# Patient Record
Sex: Female | Born: 1995 | Race: Black or African American | Hispanic: No | Marital: Single | Smoking: Never smoker
Health system: Southern US, Community
[De-identification: ages and names within clinical notes are randomized; demographics above are authoritative.]

## PROBLEM LIST (undated history)

## (undated) HISTORY — PX: TONSILLECTOMY: SUR1361

---

## 2015-07-15 ENCOUNTER — Ambulatory Visit
Admission: EM | Admit: 2015-07-15 | Discharge: 2015-07-15 | Disposition: A | Discharge status: Home / Self Care | Payer: PRIVATE HEALTH INSURANCE | Attending: Family Medicine | Admitting: Family Medicine

## 2015-07-15 ENCOUNTER — Encounter: Payer: Self-pay | Admitting: Emergency Medicine

## 2015-07-15 DIAGNOSIS — N760 Acute vaginitis: Secondary | ICD-10-CM

## 2015-07-15 DIAGNOSIS — R69 Illness, unspecified: Secondary | ICD-10-CM

## 2015-07-15 DIAGNOSIS — Z202 Contact with and (suspected) exposure to infections with a predominantly sexual mode of transmission: Secondary | ICD-10-CM

## 2015-07-15 LAB — WET PREP, GENITAL
SPERM: NONE SEEN
Trich, Wet Prep: NONE SEEN
YEAST WET PREP: NONE SEEN

## 2015-07-15 LAB — RAPID HIV SCREEN (HIV 1/2 AB+AG)
HIV 1/2 Antibodies: NONREACTIVE
HIV-1 P24 Antigen - HIV24: NONREACTIVE

## 2015-07-15 LAB — CHLAMYDIA/NGC RT PCR (ARMC ONLY)
CHLAMYDIA TR: NOT DETECTED
N gonorrhoeae: NOT DETECTED

## 2015-07-15 MED ORDER — METRONIDAZOLE 500 MG PO TABS: 500.0000 mg | ORAL_TABLET | Freq: Two times a day (BID) | ORAL | Status: DC

## 2015-07-15 NOTE — ED Provider Notes (Signed)
CSN: [id number]     Arrival date & time 07/15/15  1119 History   First MD Initiated Contact with Patient 07/15/15 1208    Nurses notes were reviewed.  Chief Complaint  Patient presents with  . Exposure to STD    As patient was texting and working on her phone she explains to me that she started having a vaginal smell on Wednesday after showering from having sexual relations with another female. She denies a discharge but states it had the smell of she's had before. It turned out that she did have sexual relations with a female partner about 2-3 weeks before that was unprotected. She denies any pain and denies having a discharge either. She has had chlamydia infection for the past one offered of testing for HIV and syphilis she's agreed to that testing as well. As she puts it she was be tested for all. She denies any lesions or any outbreaks in the vaginal area either.                 Consider location/radiation/quality/duration/timing/severity/associated sxs/prior Treatment) Patient is a 19 y.o. female presenting with STD exposure. The history is provided by the patient. No language interpreter was used.  Exposure to STD This is a new problem. The current episode started 2 days ago. The problem occurs constantly. The problem has been gradually worsening. Pertinent negatives include no chest pain, no abdominal pain and no headaches. Nothing aggravates the symptoms. Nothing relieves the symptoms. She has tried nothing for the symptoms.    History reviewed. No pertinent past medical history. Past Surgical History  Procedure Laterality Date  . Tonsillectomy     No family history on file. Social History  Substance Use Topics  . Smoking status: Never Smoker   . Smokeless tobacco: Never Used  . Alcohol Use: No   OB History    No data available     Review of Systems  Cardiovascular: Negative for chest pain.  Gastrointestinal: Negative for abdominal pain.  Genitourinary: Negative for  dysuria, urgency, frequency, vaginal bleeding, vaginal discharge, difficulty urinating, genital sores, vaginal pain, pelvic pain and dyspareunia.       Vaginal odor is present  Neurological: Negative for headaches.  All other systems reviewed and are negative.   Allergies  Review of patient's allergies indicates no known allergies.  Home Medications   Prior to Admission medications   Not on File   Meds Ordered and Administered this Visit  Medications - No data to display  BP 92/70 mmHg  Pulse 66  Temp(Src) 98.9 F (37.2 C) (Tympanic)  Resp 16  Ht 5' (1.524 m)  Wt 114 lb (51.71 kg)  BMI 22.26 kg/m2  SpO2 98% No data found.   Physical Exam  Constitutional: She is oriented to person, place, and time. She appears well-developed and well-nourished.  HENT:  Head: Normocephalic and atraumatic.  Eyes: Pupils are equal, round, and reactive to light.  Neck: Normal range of motion.  Abdominal: Soft. She exhibits no distension. There is no tenderness. Hernia confirmed negative in the right inguinal area and confirmed negative in the left inguinal area.  Genitourinary: Rectum normal and uterus normal. Rectal exam shows no external hemorrhoid, no internal hemorrhoid, no fissure, no tenderness and anal tone normal. There is no rash, tenderness or lesion on the right labia. There is no rash, tenderness or lesion on the left labia. Cervix exhibits discharge. Cervix exhibits no friability. No erythema, tenderness or bleeding in the vagina. No foreign body  around the vagina. Vaginal discharge found.  While patient denies having any type of discharge there is a heavy discharge in the posterior fornix. Discharged relation: Ashby Dawesature was be consistent with a trichomonas infection. [place] swab was used to clean the area out just to make sure there was no foreign objects in the posterior fornix and none was seen. No signs of uterine tenderness rectal examination was negative as well.  Musculoskeletal:  Normal range of motion. She exhibits no edema.  Neurological: She is alert and oriented to person, place, and time.  Skin: Skin is warm and dry.  Vitals reviewed.   ED Course  Procedures (including critical care time)  Labs Review Labs Reviewed  WET PREP, GENITAL  CHLAMYDIA/NGC RT PCR ([facility] ONLY)  RAPID HIV SCREEN (HIV 1/2 AB+AG)  RPR    Imaging Review No results found.   Visual Acuity Review  Right Eye Distance:   Left Eye Distance:   Bilateral Distance:    Right Eye Near:   Left Eye Near:    Bilateral Near:      Results for orders placed or performed during the hospital encounter of 07/15/15  Wet prep, genital  Result Value Ref Range   Yeast Wet Prep HPF POC NONE SEEN NONE SEEN   Trich, Wet Prep NONE SEEN NONE SEEN   Clue Cells Wet Prep HPF POC FEW (A) NONE SEEN   WBC, Wet Prep HPF POC MODERATE (A) NONE SEEN   Sperm PENDING   Rapid HIV screen (HIV 1/2 Ab+Ag) ([facility] Only)  Result Value Ref Range   HIV-1 P24 Antigen - HIV24 NON REACTIVE NON REACTIVE   HIV 1/2 Antibodies NON REACTIVE NON REACTIVE   Interpretation (HIV Ag Ab)      A non reactive test result means that HIV 1 or HIV 2 antibodies and HIV 1 p24 antigen were not detected in the specimen.    MDM  No diagnosis found.   Patient had heavy vaginal discharge was to correct trick but lab only shows clue cells. Will treat to cover both conditions with Flagyl 500 mg 1 tablet twice a day for 10 days. Chlamydia and GC RPR still pending HIV rapid test was negative.  Follow-up PCP of choice in about 2-3 weeks and will recommend pelvic rest also for 2 weeks as well.   Hassan RowanEugene Chaunice Obie, MD 07/15/15 [id number]

## 2015-07-15 NOTE — ED Notes (Signed)
Fishy odor, no discharge, pelvic pain, but feels swollen.

## 2015-07-15 NOTE — Discharge Instructions (Signed)
Bacterial Vaginosis °Bacterial vaginosis is an infection of the vagina. It happens when too many germs (bacteria) grow in the vagina. Having this infection puts you at risk for getting other infections from sex. Treating this infection can help lower your risk for other infections, such as:  °· Chlamydia. °· Gonorrhea. °· HIV. °· Herpes. °HOME CARE °· Take your medicine as told by your doctor. °· Finish your medicine even if you start to feel better. °· Tell your sex partner that you have an infection. They should see their doctor for treatment. °· During treatment: °· Avoid sex or use condoms correctly. °· Do not douche. °· Do not drink alcohol unless your doctor tells you it is ok. °· Do not breastfeed unless your doctor tells you it is ok. °GET HELP IF: °· You are not getting better after 3 days of treatment. °· You have more grey fluid (discharge) coming from your vagina than before. °· You have more pain than before. °· You have a fever. °MAKE SURE YOU:  °· Understand these instructions. °· Will watch your condition. °· Will get help right away if you are not doing well or get worse. °  °This information is not intended to replace advice given to you by your health care provider. Make sure you discuss any questions you have with your health care provider. °  °Document Released: 05/15/2008 Document Revised: 08/27/2014 Document Reviewed: 03/18/2013 °Elsevier Interactive Patient Education ©2016 [employer] ° °Vaginitis °Vaginitis is an inflammation of the vagina. It can happen when the normal bacteria and yeast in the vagina grow too much. There are different types. Treatment will depend on the type you have. °HOME CARE °· Take all medicines as told by your doctor. °· Keep your vagina area clean and dry. Avoid soap. Rinse the area with water. °· Avoid washing and cleaning out the vagina (douching). °· Do not use tampons or have sex (intercourse) until your treatment is done. °· Wipe from front to back after  going to the restroom. °· Wear cotton underwear. °· Avoid wearing underwear while you sleep until your vaginitis is gone. °· Avoid tight pants. Avoid underwear or nylons without a cotton panel. °· Take off wet clothing (such as a bathing suit) as soon as you can. °· Use mild, unscented products. Avoid fabric softeners and scented: °¨ Feminine sprays. °¨ Laundry detergents. °¨ Tampons. °¨ Soaps or bubble baths. °· Practice safe sex and use condoms. °GET HELP RIGHT AWAY IF:  °· You have belly (abdominal) pain. °· You have a fever or lasting symptoms for more than 2-3 days. °· You have a fever and your symptoms suddenly get worse. °MAKE SURE YOU:  °· Understand these instructions. °· Will watch this condition. °· Will get help right away if you are not doing well or get worse. °  °This information is not intended to replace advice given to you by your health care provider. Make sure you discuss any questions you have with your health care provider. °  °Document Released: 11/02/2008 Document Revised: 04/30/2012 Document Reviewed: 01/17/2012 °Elsevier Interactive Patient Education ©2016 [employer]vier Inc. ° °

## 2015-07-16 LAB — RPR: RPR Ser Ql: NONREACTIVE

## 2016-02-07 ENCOUNTER — Inpatient Hospital Stay (HOSPITAL_COMMUNITY)
Admission: AD | Admit: 2016-02-07 | Discharge: 2016-02-07 | Disposition: A | Discharge status: Home / Self Care | Payer: Self-pay | Source: Ambulatory Visit | Attending: Family Medicine | Admitting: Family Medicine

## 2016-02-07 DIAGNOSIS — L0889 Other specified local infections of the skin and subcutaneous tissue: Secondary | ICD-10-CM

## 2016-02-07 DIAGNOSIS — L089 Local infection of the skin and subcutaneous tissue, unspecified: Secondary | ICD-10-CM

## 2016-02-07 DIAGNOSIS — N6452 Nipple discharge: Secondary | ICD-10-CM | POA: Insufficient documentation

## 2016-02-07 DIAGNOSIS — N63 Unspecified lump in breast: Secondary | ICD-10-CM | POA: Insufficient documentation

## 2016-02-07 MED ORDER — SULFAMETHOXAZOLE-TRIMETHOPRIM 800-160 MG PO TABS: 1.0000 | ORAL_TABLET | Freq: Two times a day (BID) | ORAL | Status: DC

## 2016-02-07 NOTE — MAU Provider Note (Signed)
  History     CSN: [id number]  Arrival date and time: 02/07/16 2058   First Provider Initiated Contact with Patient 02/07/16 2129      Chief Complaint  Patient presents with  . Breast Mass   HPI Ms. Tracie Hogan is a 20 y.o. presents to MAU today with complaint of breast lump. The patient states that she first noted the area ~ 2 weeks ago. At the time she thought it was simply a skin issue. She states that today it became bigger and more painful. She denies itching, but has noted mild surrounding erythema. She has not taken anything for pain. She denies drainage from the area, nipple discharge, fever or other areas of concern.   OB History    No data available      No past medical history on file.  No past surgical history on file.  No family history on file.  Social History  Substance Use Topics  . Smoking status: Not on file  . Smokeless tobacco: Not on file  . Alcohol Use: Not on file    Allergies: No Known Allergies  No prescriptions prior to admission    Review of Systems  Constitutional: Negative for fever and malaise/fatigue.   Physical Exam   Blood pressure 114/80, pulse 89, temperature 98.4 F (36.9 C), temperature source Oral, resp. rate 18, height 5' (1.524 m), weight 102 lb 6.4 oz (46.448 kg), last menstrual period 01/11/2016, SpO2 100 %.  Physical Exam  Nursing note and vitals reviewed. Constitutional: She is oriented to person, place, and time. She appears well-developed and well-nourished. No distress.  HENT:  Head: Normocephalic and atraumatic.  Cardiovascular: Normal rate.   Respiratory: Effort normal. Right breast exhibits no inverted nipple, no mass, no nipple discharge, no skin change and no tenderness. Left breast exhibits skin change. Left breast exhibits no inverted nipple, no mass, no nipple discharge and no tenderness. Breasts are symmetrical.    GI: Soft. She exhibits no distension.  Neurological: She is alert and oriented to person,  place, and time.  Skin: Skin is warm and dry. No erythema.  Psychiatric: She has a normal mood and affect.    MAU Course  Procedures None  MDM Discussed with patient that this is most likely a skin issue and not related to the breast tissue. The patient desires further evaluation. Advised that we would refer to the breast center as [facility].   Assessment and Plan  A: Pusutle on breast Breast lump, 3:00   A: Discharge home Rx for Bactrim given to patient  Warning signs for worsening condition discussed Patient [facility] Patient may return to MAU as needed or if her condition were to change or worsen   Marny LowensteinJulie N Shannen [place]ans[place], PA-C  02/08/2016, 12:29 AM

## 2016-02-07 NOTE — Discharge Instructions (Signed)
Abscess °An abscess (boil or furuncle) is an infected area on or under the skin. This area is filled with yellowish-white fluid (pus) and other material (debris). °HOME CARE  °· Only take medicines as told by your doctor. °· If you were given antibiotic medicine, take it as directed. Finish the medicine even if you start to feel better. °· If gauze is used, follow your doctor's directions for changing the gauze. °· To avoid spreading the infection: °¨ Keep your abscess covered with a bandage. °¨ Wash your hands well. °¨ Do not share personal care items, towels, or whirlpools with others. °¨ Avoid skin contact with others. °· Keep your skin and clothes clean around the abscess. °· Keep all doctor visits as told. °GET HELP RIGHT AWAY IF:  °· You have more pain, puffiness (swelling), or redness in the wound site. °· You have more fluid or blood coming from the wound site. °· You have muscle aches, chills, or you feel sick. °· You have a fever. °MAKE SURE YOU:  °· Understand these instructions. °· Will watch your condition. °· Will get help right away if you are not doing well or get worse. °  °This information is not intended to replace advice given to you by your health care provider. Make sure you discuss any questions you have with your health care provider. °  °Document Released: 01/23/2008 Document Revised: 02/05/2012 Document Reviewed: 10/20/2011 °Elsevier Interactive Patient Education ©2016 Elsevier Inc. ° °

## 2016-02-07 NOTE — MAU Note (Signed)
Pt has a bump on left breast-states that it is painful and swollen. Has been there about 1-2 weeks now.

## 2016-02-08 ENCOUNTER — Other Ambulatory Visit: Payer: Self-pay | Admitting: Medical

## 2016-02-08 DIAGNOSIS — N63 Unspecified lump in unspecified breast: Secondary | ICD-10-CM

## 2016-02-14 ENCOUNTER — Ambulatory Visit
Admission: RE | Admit: 2016-02-14 | Discharge: 2016-02-14 | Disposition: A | Discharge status: Home / Self Care | Payer: Medicaid Other | Source: Ambulatory Visit | Attending: Medical | Admitting: Medical

## 2016-02-14 DIAGNOSIS — N63 Unspecified lump in unspecified breast: Secondary | ICD-10-CM

## 2017-08-16 ENCOUNTER — Encounter (HOSPITAL_COMMUNITY): Payer: Self-pay

## 2017-08-16 ENCOUNTER — Emergency Department (HOSPITAL_COMMUNITY)
Admission: EM | Admit: 2017-08-16 | Discharge: 2017-08-19 | Disposition: A | Discharge status: Home / Self Care | Payer: Self-pay | Attending: Emergency Medicine | Admitting: Emergency Medicine

## 2017-08-16 ENCOUNTER — Other Ambulatory Visit: Payer: Self-pay

## 2017-08-16 DIAGNOSIS — F23 Brief psychotic disorder: Secondary | ICD-10-CM | POA: Diagnosis present

## 2017-08-16 DIAGNOSIS — F12150 Cannabis abuse with psychotic disorder with delusions: Secondary | ICD-10-CM | POA: Diagnosis present

## 2017-08-16 DIAGNOSIS — F29 Unspecified psychosis not due to a substance or known physiological condition: Secondary | ICD-10-CM | POA: Insufficient documentation

## 2017-08-16 LAB — RAPID URINE DRUG SCREEN, HOSP PERFORMED
Amphetamines: NOT DETECTED
BENZODIAZEPINES: NOT DETECTED
Barbiturates: NOT DETECTED
Cocaine: NOT DETECTED
OPIATES: NOT DETECTED
Tetrahydrocannabinol: POSITIVE — AB

## 2017-08-16 LAB — URINALYSIS, ROUTINE W REFLEX MICROSCOPIC
BACTERIA UA: NONE SEEN
BILIRUBIN URINE: NEGATIVE
Glucose, UA: NEGATIVE mg/dL
HGB URINE DIPSTICK: NEGATIVE
Ketones, ur: 5 mg/dL — AB
Nitrite: NEGATIVE
PROTEIN: NEGATIVE mg/dL
Specific Gravity, Urine: 1.013 (ref 1.005–1.030)
pH: 6 (ref 5.0–8.0)

## 2017-08-16 LAB — ETHANOL

## 2017-08-16 LAB — I-STAT BETA HCG BLOOD, ED (MC, WL, AP ONLY): I-stat hCG, quantitative: <5 m[IU]/mL (ref <5)

## 2017-08-16 LAB — COMPREHENSIVE METABOLIC PANEL
ALBUMIN: 5.1 g/dL — AB (ref 3.5–5.0)
ALT: 12 U/L — ABNORMAL LOW (ref 14–54)
AST: 29 U/L (ref 15–41)
Alkaline Phosphatase: 59 U/L (ref 38–126)
Anion gap: 11 (ref 5–15)
BUN: <5 mg/dL — ABNORMAL LOW (ref 6–20)
CHLORIDE: 102 mmol/L (ref 101–111)
CO2: 23 mmol/L (ref 22–32)
Calcium: 9.7 mg/dL (ref 8.9–10.3)
Creatinine, Ser: 0.62 mg/dL (ref 0.44–1.00)
GFR calc Af Amer: >60 mL/min (ref >60)
GFR calc non Af Amer: >60 mL/min (ref >60)
Glucose, Bld: 94 mg/dL (ref 65–99)
POTASSIUM: 3.2 mmol/L — AB (ref 3.5–5.1)
Sodium: 136 mmol/L (ref 135–145)
Total Bilirubin: 1.5 mg/dL — ABNORMAL HIGH (ref 0.3–1.2)
Total Protein: 8.3 g/dL — ABNORMAL HIGH (ref 6.5–8.1)

## 2017-08-16 LAB — CBC
HCT: 43.2 % (ref 36.0–46.0)
Hemoglobin: 14.9 g/dL (ref 12.0–15.0)
MCH: 33 pg (ref 26.0–34.0)
MCHC: 34.5 g/dL (ref 30.0–36.0)
MCV: 95.6 fL (ref 78.0–100.0)
PLATELETS: 342 10*3/uL (ref 150–400)
RBC: 4.52 MIL/uL (ref 3.87–5.11)
RDW: 13.2 % (ref 11.5–15.5)
WBC: 6.3 10*3/uL (ref 4.0–10.5)

## 2017-08-16 LAB — ACETAMINOPHEN LEVEL: Acetaminophen (Tylenol), Serum: <10 ug/mL — ABNORMAL LOW (ref 10–30)

## 2017-08-16 LAB — SALICYLATE LEVEL

## 2017-08-16 MED ORDER — LORAZEPAM 0.5 MG PO TABS: 0.5000 mg | ORAL_TABLET | Freq: Once | ORAL | Status: DC

## 2017-08-16 MED ORDER — STERILE WATER FOR INJECTION IJ SOLN
INTRAMUSCULAR | Status: AC
  Filled 2017-08-16: qty 10

## 2017-08-16 MED ORDER — ZIPRASIDONE MESYLATE 20 MG IM SOLR
10.0000 mg | Freq: Two times a day (BID) | INTRAMUSCULAR | Status: DC | PRN
  Administered 2017-08-16: 10 mg via INTRAMUSCULAR
  Filled 2017-08-16: qty 20

## 2017-08-16 MED ORDER — LORAZEPAM 0.5 MG PO TABS
0.5000 mg | ORAL_TABLET | ORAL | Status: AC | PRN
  Administered 2017-08-16: 0.5 mg via ORAL
  Filled 2017-08-16: qty 1

## 2017-08-16 NOTE — ED Notes (Signed)
Hourly rounding reveals patient sleeping in room. No complaints, stable, in no acute distress. Q15 minute rounds and monitoring via Security Cameras to continue. 

## 2017-08-16 NOTE — ED Notes (Signed)
Pt. Transferred to [facility] from ED to room 36 after screening for contraband. Report to include Situation, Background, Assessment and [facility] Pt. Oriented to unit including Q15 minute rounds as well as the security cameras for their protection. Patient is alert and oriented, warm and dry in no acute distress. Patient denies SI, HI, and AVH. Pt. Encouraged to let me know if needs arise.

## 2017-08-16 NOTE — SANE Note (Signed)
SANE consult has been attempted. Primary or charge RN and provider have been notified. Please contact the SANE nurse on call (listed in Amion) with any further questions or concerns and if/when patient responds appropriately to questions.
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
## 2017-08-16 NOTE — ED Notes (Signed)
SANE nurse at patient bedside

## 2017-08-16 NOTE — ED Notes (Signed)
[facility] Expected date:  Expected time:  Means of arrival:  Comments: [name]
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
## 2017-08-16 NOTE — BH Assessment (Signed)
[facility] Assessment Progress Note  Per Nira ConnJason Berry, NP pt meets criteria for inpt treatment. EDP Cristina GongHammond, Elizabeth W, PA-C has been notified of the disposition. Pt's nurse Angelica ChessmanMandy, RN has been made aware of recommendation. [facility], RN is reviewing for [facility] at [facility].   Princess BruinsAquicha Keshonda Monsour, MSW, LCSW Therapeutic Triage Specialist  [phone]

## 2017-08-16 NOTE — ED Notes (Signed)
Patient changed into paper scrubs and belongings placed in cabinet.  Patient continues to have nose ring and underwear on.
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
## 2017-08-17 DIAGNOSIS — F12150 Cannabis abuse with psychotic disorder with delusions: Secondary | ICD-10-CM

## 2017-08-17 DIAGNOSIS — G4700 Insomnia, unspecified: Secondary | ICD-10-CM

## 2017-08-17 DIAGNOSIS — F23 Brief psychotic disorder: Secondary | ICD-10-CM

## 2017-08-17 DIAGNOSIS — R443 Hallucinations, unspecified: Secondary | ICD-10-CM

## 2017-08-17 DIAGNOSIS — R450 Nervousness: Secondary | ICD-10-CM

## 2017-08-17 DIAGNOSIS — F419 Anxiety disorder, unspecified: Secondary | ICD-10-CM

## 2017-08-17 DIAGNOSIS — F1910 Other psychoactive substance abuse, uncomplicated: Secondary | ICD-10-CM

## 2017-08-17 LAB — HIV ANTIBODY (ROUTINE TESTING W REFLEX): HIV Screen 4th Generation wRfx: NONREACTIVE

## 2017-08-17 MED ORDER — ZIPRASIDONE MESYLATE 20 MG IM SOLR: 10.0000 mg | Freq: Once | INTRAMUSCULAR | Status: DC

## 2017-08-17 MED ORDER — HYDROXYZINE HCL 10 MG PO TABS
10.0000 mg | ORAL_TABLET | Freq: Two times a day (BID) | ORAL | Status: DC
  Administered 2017-08-17 – 2017-08-19 (×5): 10 mg via ORAL
  Filled 2017-08-17 (×5): qty 1

## 2017-08-17 MED ORDER — HALOPERIDOL 2 MG PO TABS
2.0000 mg | ORAL_TABLET | Freq: Two times a day (BID) | ORAL | Status: DC
  Administered 2017-08-17 – 2017-08-18 (×4): 2 mg via ORAL
  Filled 2017-08-17 (×5): qty 1

## 2017-08-17 MED ORDER — STERILE WATER FOR INJECTION IJ SOLN
INTRAMUSCULAR | Status: AC
  Administered 2017-08-17: 10 mL
  Filled 2017-08-17: qty 10

## 2017-08-17 MED ORDER — DIPHENHYDRAMINE HCL 50 MG/ML IJ SOLN
50.0000 mg | Freq: Once | INTRAMUSCULAR | Status: AC
  Administered 2017-08-17: 50 mg via INTRAMUSCULAR
  Filled 2017-08-17: qty 1

## 2017-08-17 MED ORDER — OLANZAPINE 10 MG IM SOLR
10.0000 mg | Freq: Once | INTRAMUSCULAR | Status: AC
  Administered 2017-08-17: 10 mg via INTRAMUSCULAR
  Filled 2017-08-17: qty 10

## 2017-08-17 MED ORDER — GABAPENTIN 100 MG PO CAPS
200.0000 mg | ORAL_CAPSULE | Freq: Two times a day (BID) | ORAL | Status: DC
  Administered 2017-08-17 – 2017-08-19 (×5): 200 mg via ORAL
  Filled 2017-08-17 (×5): qty 2

## 2017-08-17 MED ORDER — LORAZEPAM 2 MG/ML IJ SOLN: 1.0000 mg | Freq: Once | INTRAMUSCULAR | Status: DC
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
## 2017-08-17 NOTE — ED Notes (Signed)
Snack and beverage given. 

## 2017-08-17 NOTE — ED Notes (Signed)
Hourly rounding reveals patient sleeping in room. No complaints, stable, in no acute distress. Q15 minute rounds and monitoring via Security Cameras to continue. 
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
## 2017-08-17 NOTE — ED Notes (Signed)
Up tot he bathroom to shower and change scrubs 
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
## 2017-08-17 NOTE — ED Notes (Addendum)
Pt medicated w/o difficulty, support given.
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
## 2017-08-18 LAB — URINE CULTURE

## 2017-08-18 NOTE — Progress Notes (Signed)
[facility] [place] [place], [place]. Garner NashGregory Momo Braun, MSW, LCSW Clinical Social Worker 08/18/2017 11:42 AM

## 2017-08-18 NOTE — ED Notes (Signed)
Report to include situation, background, assessment and recommendations from Barbara RN. Patient sleeping, respirations regular and unlabored. Q15 minute rounds and security camera observation to continue.   

## 2017-08-18 NOTE — ED Notes (Signed)
Patient alert and guarded. Patient will respond to questions but she appears to have some thought blocking when she goes to answer. Patient is a little guarded. She did agree to take her medication. Patient did state she had some anxiety this morning. Patient informed she would receive some medication to help with that this morning. Q 15 minute checks in progress and patient remains safe on unit.

## 2017-08-18 NOTE — ED Notes (Signed)
Hourly rounding reveals patient sleeping in room. No complaints, stable, in no acute distress. Q15 minute rounds and monitoring via Security Cameras to continue. 

## 2017-08-18 NOTE — Consult Note (Signed)
[facility] Psych ED Progress Note  08/18/2017 11:01 AM Tracie Hogan  MRN:  [id number] Subjective:  Tracie Hogan is a 21 y.o. female patient who was admitted with disorganized, bizarre thinking/behavior.  HPI:  Patient was seen, chart reviewed and her case discussed with the treatment team. Patient is a poor historian who is unable to articulate the sequence of events that brought her to the hospital due to thought blocking. Patient remains  disorganized, hypervigilant, paranoid, still hearing voices and seeing things that are not there.  Principal Problem: Cannabis abuse with psychotic disorder, with delusions ([place]) Diagnosis:   Patient Active Problem List   Diagnosis Date Noted  . Cannabis abuse with psychotic disorder, with delusions ([place]) [F12.150] 08/17/2017    Priority: High  . Brief reactive psychosis with marked stressor ([place]) [F23] 08/17/2017    Priority: High   Total Time spent with patient: 30 minutes  Past Psychiatric History: as above  Past Medical History: History reviewed. No pertinent past medical history.  Past Surgical History:  Procedure Laterality Date  . TONSILLECTOMY     Family History: History reviewed. No pertinent family history. Family Psychiatric  History: unknown by the patient. Social History:  Social History   Substance and Sexual Activity  Alcohol Use No     Social History   Substance and Sexual Activity  Drug Use No    Social History   Socioeconomic History  . Marital status: Single    Spouse name: None  . Number of children: None  . Years of education: None  . Highest education level: None  Social Needs  . Financial resource strain: None  . Food insecurity - worry: None  . Food insecurity - inability: None  . Transportation needs - medical: None  . Transportation needs - non-medical: None  Occupational History  . None  Tobacco Use  . Smoking status: Never Smoker  . Smokeless tobacco: Never Used  Substance and Sexual Activity  .  Alcohol use: No  . Drug use: No  . Sexual activity: None  Other Topics Concern  . None  Social History Narrative  . None    Sleep: Fair  Appetite:  Poor  Current Medications: Current Facility-Administered Medications  Medication Dose Route Frequency Provider Last Rate Last Dose  . gabapentin (NEURONTIN) capsule 200 mg  200 mg Oral BID Moncia Annas, MD   200 mg at 08/18/17 1017  . haloperidol (HALDOL) tablet 2 mg  2 mg Oral BID Raechell Singleton, MD   2 mg at 08/18/17 5102  . hydrOXYzine (ATARAX/VISTARIL) tablet 10 mg  10 mg Oral BID Corena Pilgrim, MD   10 mg at 08/18/17 5852   Current Outpatient Medications  Medication Sig Dispense Refill  . metroNIDAZOLE (FLAGYL) 500 MG tablet Take 1 tablet (500 mg total) by mouth 2 (two) times daily. (Patient not taking: Reported on 08/16/2017) 20 tablet 0  . sulfamethoxazole-trimethoprim (BACTRIM DS,SEPTRA DS) 800-160 MG tablet Take 1 tablet by mouth 2 (two) times daily. (Patient not taking: Reported on 08/16/2017) 14 tablet 0    Lab Results:  Results for orders placed or performed during the hospital encounter of 08/16/17 (from the past 48 hour(s))  Urinalysis, Routine w reflex microscopic     Status: Abnormal   Collection Time: 08/16/17  3:23 PM  Result Value Ref Range   Color, Urine YELLOW YELLOW   APPearance HAZY (A) CLEAR   Specific Gravity, Urine 1.013 1.005 - 1.030   pH 6.0 5.0 - 8.0   Glucose, UA NEGATIVE  NEGATIVE mg/dL   Hgb urine dipstick NEGATIVE NEGATIVE   Bilirubin Urine NEGATIVE NEGATIVE   Ketones, ur 5 (A) NEGATIVE mg/dL   Protein, ur NEGATIVE NEGATIVE mg/dL   Nitrite NEGATIVE NEGATIVE   Leukocytes, UA SMALL (A) NEGATIVE   RBC / HPF 0-5 0 - 5 RBC/hpf   WBC, UA 0-5 0 - 5 WBC/hpf   Bacteria, UA NONE SEEN NONE SEEN   Squamous Epithelial / LPF 0-5 (A) NONE SEEN   Mucus PRESENT    Amorphous Crystal PRESENT   Urine culture     Status: Abnormal   Collection Time: 08/16/17  3:23 PM  Result Value Ref Range   Specimen  Description URINE, CLEAN CATCH    Special Requests NONE    Culture MULTIPLE SPECIES PRESENT, SUGGEST RECOLLECTION (A)    Report Status 08/18/2017 FINAL   Rapid urine drug screen (hospital performed)     Status: Abnormal   Collection Time: 08/16/17  3:58 PM  Result Value Ref Range   Opiates NONE DETECTED NONE DETECTED   Cocaine NONE DETECTED NONE DETECTED   Benzodiazepines NONE DETECTED NONE DETECTED   Amphetamines NONE DETECTED NONE DETECTED   Tetrahydrocannabinol POSITIVE (A) NONE DETECTED   Barbiturates NONE DETECTED NONE DETECTED    Comment: (NOTE) DRUG SCREEN FOR MEDICAL PURPOSES ONLY.  IF CONFIRMATION IS NEEDED FOR ANY PURPOSE, NOTIFY LAB WITHIN 5 DAYS. LOWEST DETECTABLE LIMITS FOR URINE DRUG SCREEN Drug Class                     Cutoff (ng/mL) Amphetamine and metabolites    1000 Barbiturate and metabolites    200 Benzodiazepine                 151 Tricyclics and metabolites     300 Opiates and metabolites        300 Cocaine and metabolites        300 THC                            50   Comprehensive metabolic panel     Status: Abnormal   Collection Time: 08/16/17  4:30 PM  Result Value Ref Range   Sodium 136 135 - 145 mmol/L   Potassium 3.2 (L) 3.5 - 5.1 mmol/L   Chloride 102 101 - 111 mmol/L   CO2 23 22 - 32 mmol/L   Glucose, Bld 94 65 - 99 mg/dL   BUN <5 (L) 6 - 20 mg/dL   Creatinine, Ser 0.62 0.44 - 1.00 mg/dL   Calcium 9.7 8.9 - 10.3 mg/dL   Total Protein 8.3 (H) 6.5 - 8.1 g/dL   Albumin 5.1 (H) 3.5 - 5.0 g/dL   AST 29 15 - 41 U/L   ALT 12 (L) 14 - 54 U/L   Alkaline Phosphatase 59 38 - 126 U/L   Total Bilirubin 1.5 (H) 0.3 - 1.2 mg/dL   GFR calc non Af Amer >60 >60 mL/min   GFR calc Af Amer >60 >60 mL/min    Comment: (NOTE) The eGFR has been calculated using the CKD EPI equation. This calculation has not been validated in all clinical situations. eGFR's persistently <60 mL/min signify possible Chronic Kidney Disease.    Anion gap 11 5 - 15  Ethanol      Status: None   Collection Time: 08/16/17  4:30 PM  Result Value Ref Range   Alcohol, Ethyl (B) <10 <10 mg/dL  Comment:        LOWEST DETECTABLE LIMIT FOR SERUM ALCOHOL IS 10 mg/dL FOR MEDICAL PURPOSES ONLY   Salicylate level     Status: None   Collection Time: 08/16/17  4:30 PM  Result Value Ref Range   Salicylate Lvl <5.0 2.8 - 30.0 mg/dL  Acetaminophen level     Status: Abnormal   Collection Time: 08/16/17  4:30 PM  Result Value Ref Range   Acetaminophen (Tylenol), Serum <10 (L) 10 - 30 ug/mL    Comment:        THERAPEUTIC CONCENTRATIONS VARY SIGNIFICANTLY. A RANGE OF 10-30 ug/mL MAY BE AN EFFECTIVE CONCENTRATION FOR MANY PATIENTS. HOWEVER, SOME ARE BEST TREATED AT CONCENTRATIONS OUTSIDE THIS RANGE. ACETAMINOPHEN CONCENTRATIONS >150 ug/mL AT 4 HOURS AFTER INGESTION AND >50 ug/mL AT 12 HOURS AFTER INGESTION ARE OFTEN ASSOCIATED WITH TOXIC REACTIONS.   cbc     Status: None   Collection Time: 08/16/17  4:30 PM  Result Value Ref Range   WBC 6.3 4.0 - 10.5 K/uL   RBC 4.52 3.87 - 5.11 MIL/uL   Hemoglobin 14.9 12.0 - 15.0 g/dL   HCT 43.2 36.0 - 46.0 %   MCV 95.6 78.0 - 100.0 fL   MCH 33.0 26.0 - 34.0 pg   MCHC 34.5 30.0 - 36.0 g/dL   RDW 13.2 11.5 - 15.5 %   Platelets 342 150 - 400 K/uL  HIV antibody     Status: None   Collection Time: 08/16/17  4:30 PM  Result Value Ref Range   HIV Screen 4th Generation wRfx Non Reactive Non Reactive    Comment: (NOTE) Performed At: [facility] [address] [place], [place] [id number] Rush Farmer MD [id number]   I-Stat beta hCG blood, ED     Status: None   Collection Time: 08/16/17  4:55 PM  Result Value Ref Range   I-stat hCG, quantitative <5.0 <5 mIU/mL   Comment 3            Comment:   GEST. AGE      CONC.  (mIU/mL)   <=1 WEEK        5 - 50     2 WEEKS       50 - 500     3 WEEKS       100 - 10,000     4 WEEKS     1,000 - 30,000        FEMALE AND NON-PREGNANT FEMALE:     LESS THAN 5 mIU/mL     Blood  Alcohol level:  Lab Results  Component Value Date   ETH <10 08/16/2017    Physical Findings: AIMS:  , ,  ,  ,    CIWA:    COWS:     Musculoskeletal: Strength & Muscle Tone: within normal limits Gait & Station: normal Patient leans: N/A  Psychiatric Specialty Exam: Physical Exam  Psychiatric: Judgment normal. Her mood appears anxious. Her affect is blunt. Her speech is delayed. She is slowed, withdrawn and actively hallucinating. Thought content is paranoid. Cognition and memory are normal.    Review of Systems  Constitutional: Negative.   HENT: Negative.   Eyes: Negative.   Respiratory: Negative.   Cardiovascular: Negative.   Gastrointestinal: Negative.   Genitourinary: Negative.   Musculoskeletal: Negative.   Skin: Negative.   Neurological: Negative.   Endo/Heme/Allergies: Negative.   Psychiatric/Behavioral: Positive for hallucinations and substance abuse. The patient is nervous/anxious.     Blood pressure 125/86, pulse  92, temperature 98.5 F (36.9 C), resp. rate 20, SpO2 98 %.There is no height or weight on file to calculate BMI.  General Appearance: Casual  Eye Contact:  Minimal  Speech:  Blocked and Slow  Volume:  Decreased  Mood:  Anxious  Affect:  Blunt and Constricted  Thought Process:  Disorganized  Orientation:  Other:  only to person and place  Thought Content:  Illogical, Hallucinations: Auditory Visual and Paranoid Ideation  Suicidal Thoughts:  No  Homicidal Thoughts:  No  Memory:  Immediate;   Fair Recent;   Poor Remote;   Poor  Judgement:  Poor  Insight:  Shallow  Psychomotor Activity:  Decreased and Psychomotor Retardation  Concentration:  Concentration: Fair and Attention Span: Fair  Recall:  Poor  Fund of Knowledge:  Fair  Language:  Fair  Akathisia:  No  Handed:  Right  AIMS (if indicated):     Assets:  Desire for Improvement  ADL's:  Intact  Cognition:  WNL  Sleep:   fair    Treatment Plan Summary: Daily contact with patient to  assess and evaluate symptoms and progress in treatment and Medication management -Contact Gabapentin 200 mg bid for anxiety, Haldol 2 mg bid for psychosis and Hydroxyzine 10 mg bid for anxiety  Disposition: Recommend psychiatric Inpatient admission when medically cleared.    Corena Pilgrim, MD 08/18/2017, 11:01 AM

## 2017-08-18 NOTE — ED Notes (Signed)
Dinner meal tray given to patient. 

## 2017-08-18 NOTE — ED Notes (Signed)
Lunch meal tray given to patient.

## 2017-08-18 NOTE — ED Notes (Signed)
Patient given breakfast meal tray. 

## 2017-08-19 LAB — GC/CHLAMYDIA PROBE AMP (~~LOC~~) NOT AT ARMC
Chlamydia: NEGATIVE
Neisseria Gonorrhea: NEGATIVE

## 2017-08-19 MED ORDER — HALOPERIDOL 5 MG PO TABS
5.0000 mg | ORAL_TABLET | Freq: Two times a day (BID) | ORAL | Status: DC
Start: 2017-08-19 — End: 2017-08-19
  Administered 2017-08-19: 5 mg via ORAL

## 2017-08-19 MED ORDER — LORAZEPAM 1 MG PO TABS
1.0000 mg | ORAL_TABLET | Freq: Once | ORAL | Status: AC
  Administered 2017-08-19: 1 mg via ORAL
  Filled 2017-08-19: qty 1

## 2017-08-19 NOTE — ED Notes (Signed)
Hourly rounding reveals patient sleeping in room. No complaints, stable, in no acute distress. Q15 minute rounds and monitoring via Security Cameras to continue. 

## 2017-08-19 NOTE — ED Notes (Signed)
[facility] Edwards [facility]  Sheriff called for transport.

## 2017-08-19 NOTE — BH Assessment (Signed)
Pt sitting on bed and staring at ceiling. She reports she slept "okay" and she ate a bit of her breakfast. Writer asks what pt looking at on ceiling, pt continues to stare and doesn't answer. She begins screaming in hallway soon after reassessment. Roosvelt HarpsJackie Norman DO and Elta GuadeloupeLaurie Parks NP recommend 500 hall for pt [facility].  Evette Cristalaroline Paige Makaya [place], [place] Therapeutic Triage Specialist

## 2017-08-19 NOTE — ED Notes (Signed)
Patient left room and got into shower with her scrubs on, then walked back to room soaking wet.  Patient tried to put on dry scrubs over wet body.  Assisted patient to get dried off and back in bed.

## 2017-08-19 NOTE — ED Notes (Signed)
[facility].  Nurse to call back.

## 2017-08-19 NOTE — ED Notes (Signed)
Patient yelling out and making strange noises in her room.  Nurse tried to verbally interact with patient and reassure her, but patient continued yelling.  Tech at bedside comforting patient and nurse going to get patients po medications.  Patient took medications without difficulty.  Walking in hall now.

## 2017-08-19 NOTE — BH Assessment (Signed)
[facility]  Per Juanetta BeetsJacqueline Norman, DO, this pt requires psychiatric hospitalization at this time.  Pt presents under IVC initiated by [facility], which Thedore MinsMojeed Akintayo, MD has upheld.  At 14:13 Britta MccreedyBarbara calls from [facility] to report that pt has been accepted to their facility by Dr Gearldine BienenstockSamantha Suffren.  Laveda AbbeLaurie Britton Parks, FNP concurs with this decision.  Pt's nurse, Morrie Sheldonshley, has been notified, and agrees to call report to [phone].  [facility] stipulates that pt must arrive either before 23:00 tonight, or after 06:00 tomorrow.  [place] [facility]  Doylene Canninghomas Rueben Kassim Behavioral Health Coordinator [phone]

## 2018-05-14 ENCOUNTER — Emergency Department (HOSPITAL_COMMUNITY)
Admission: EM | Admit: 2018-05-14 | Discharge: 2018-05-14 | Disposition: A | Discharge status: Other Acute Inpatient Hospital | Payer: Medicaid Other | Attending: Emergency Medicine | Admitting: Emergency Medicine

## 2018-05-14 ENCOUNTER — Other Ambulatory Visit: Payer: Self-pay

## 2018-05-14 ENCOUNTER — Encounter (HOSPITAL_COMMUNITY): Payer: Self-pay

## 2018-05-14 DIAGNOSIS — E876 Hypokalemia: Secondary | ICD-10-CM

## 2018-05-14 DIAGNOSIS — F1210 Cannabis abuse, uncomplicated: Secondary | ICD-10-CM | POA: Insufficient documentation

## 2018-05-14 DIAGNOSIS — F319 Bipolar disorder, unspecified: Secondary | ICD-10-CM

## 2018-05-14 DIAGNOSIS — F29 Unspecified psychosis not due to a substance or known physiological condition: Secondary | ICD-10-CM

## 2018-05-14 DIAGNOSIS — F062 Psychotic disorder with delusions due to known physiological condition: Secondary | ICD-10-CM | POA: Insufficient documentation

## 2018-05-14 LAB — COMPREHENSIVE METABOLIC PANEL
ALK PHOS: 50 U/L (ref 38–126)
ALT: 12 U/L (ref 0–44)
AST: 21 U/L (ref 15–41)
Albumin: 5 g/dL (ref 3.5–5.0)
Anion gap: 14 (ref 5–15)
BUN: 8 mg/dL (ref 6–20)
CALCIUM: 10.2 mg/dL (ref 8.9–10.3)
CO2: 23 mmol/L (ref 22–32)
CREATININE: 0.82 mg/dL (ref 0.44–1.00)
Chloride: 104 mmol/L (ref 98–111)
Glucose, Bld: 89 mg/dL (ref 70–99)
Potassium: 3.2 mmol/L — ABNORMAL LOW (ref 3.5–5.1)
SODIUM: 141 mmol/L (ref 135–145)
TOTAL PROTEIN: 8.2 g/dL — AB (ref 6.5–8.1)
Total Bilirubin: 1.5 mg/dL — ABNORMAL HIGH (ref 0.3–1.2)

## 2018-05-14 LAB — CBC
HCT: 42.3 % (ref 36.0–46.0)
Hemoglobin: 14.4 g/dL (ref 12.0–15.0)
MCH: 32.7 pg (ref 26.0–34.0)
MCHC: 34 g/dL (ref 30.0–36.0)
MCV: 95.9 fL (ref 78.0–100.0)
PLATELETS: 377 10*3/uL (ref 150–400)
RBC: 4.41 MIL/uL (ref 3.87–5.11)
RDW: 13.2 % (ref 11.5–15.5)
WBC: 7.9 10*3/uL (ref 4.0–10.5)

## 2018-05-14 LAB — RAPID URINE DRUG SCREEN, HOSP PERFORMED
Amphetamines: NOT DETECTED
Barbiturates: NOT DETECTED
Benzodiazepines: NOT DETECTED
COCAINE: NOT DETECTED
OPIATES: NOT DETECTED
TETRAHYDROCANNABINOL: POSITIVE — AB

## 2018-05-14 LAB — ACETAMINOPHEN LEVEL: Acetaminophen (Tylenol), Serum: <10 ug/mL — ABNORMAL LOW (ref 10–30)

## 2018-05-14 LAB — ETHANOL

## 2018-05-14 LAB — SALICYLATE LEVEL

## 2018-05-14 LAB — HCG, SERUM, QUALITATIVE: Preg, Serum: NEGATIVE

## 2018-05-14 MED ORDER — RISPERIDONE 0.5 MG PO TABS
0.5000 mg | ORAL_TABLET | Freq: Two times a day (BID) | ORAL | Status: DC
  Administered 2018-05-14 (×2): 0.5 mg via ORAL
  Filled 2018-05-14 (×2): qty 1

## 2018-05-14 MED ORDER — LORAZEPAM 2 MG/ML IJ SOLN
1.0000 mg | Freq: Once | INTRAMUSCULAR | Status: AC | PRN
  Administered 2018-05-14: 1 mg via INTRAMUSCULAR
  Filled 2018-05-14: qty 1

## 2018-05-14 MED ORDER — POTASSIUM CHLORIDE CRYS ER 20 MEQ PO TBCR
40.0000 meq | EXTENDED_RELEASE_TABLET | Freq: Three times a day (TID) | ORAL | Status: DC
  Administered 2018-05-14: 40 meq via ORAL
  Filled 2018-05-14: qty 2

## 2018-05-14 MED ORDER — TRAZODONE HCL 50 MG PO TABS
50.0000 mg | ORAL_TABLET | Freq: Every day | ORAL | Status: DC
  Administered 2018-05-14: 50 mg via ORAL
  Filled 2018-05-14: qty 1

## 2018-05-14 MED ORDER — LAMOTRIGINE 25 MG PO TABS
25.0000 mg | ORAL_TABLET | Freq: Every day | ORAL | Status: DC
  Administered 2018-05-14 (×2): 25 mg via ORAL
  Filled 2018-05-14 (×2): qty 1

## 2018-05-14 MED ORDER — STERILE WATER FOR INJECTION IJ SOLN
INTRAMUSCULAR | Status: AC
  Administered 2018-05-14: 1.2 mL
  Filled 2018-05-14: qty 10

## 2018-05-14 MED ORDER — POTASSIUM CHLORIDE CRYS ER 20 MEQ PO TBCR: 40.0000 meq | EXTENDED_RELEASE_TABLET | Freq: Once | ORAL | Status: DC

## 2018-05-14 MED ORDER — ZIPRASIDONE MESYLATE 20 MG IM SOLR
10.0000 mg | Freq: Once | INTRAMUSCULAR | Status: AC
  Administered 2018-05-14: 10 mg via INTRAMUSCULAR
  Filled 2018-05-14: qty 20

## 2018-05-14 NOTE — ED Notes (Addendum)
Patient up again walking to bathroom moaning in front of bathroom.  Patient walked into bathroom again and appears to be getting sleepy from medications.  Patient at window staring into nurse's station.  Patient urged to go back to her room to lie down and get some rest.  Tech talking to patient at bathroom door attending to her safety.

## 2018-05-14 NOTE — ED Triage Notes (Signed)
Pt Belongings: 2 Bags Bag 1: Black Purse [red iphone (chipped edge) w/ clear and gold case; rose gold iphone (fully cracked screen); set of keys; pink wallet (BOA [phone], [employer] visa [id number], [place]) eye glasses, 3 contact cases and various msc. Items} Bag 2: clothes, shoes, other msc items  **Locker 35

## 2018-05-14 NOTE — ED Notes (Signed)
Patient walking around the unit going into other patient's rooms.  Calm and cooperative but with a blank stare and flat affect.

## 2018-05-14 NOTE — BH Assessment (Signed)
Assessment Note  Tracie Hogan is an 22 y.o. female. Pt was poor historian. Pt did not appear to be oriented. The Pt would only mumble during the assessment. The Pt would also look into space. Pt has a mental health history. Pt has never been hospitalized.   Per Epic notes:"When I asked the patient what happened tonight she states "I do not remember".  She does not seem to know where she is although she did know she was in [place].  When I asked her where she lives she states "in this bed".  I asked her how she spends her time and she states "trying to help people" and then correcting herself and saying she is trying to "heal" people.  When I asked her how she heals people she states "by sacrificing my natural energy".  She states she has not been sleeping but "feels like I have been".  She admits to hearing voices and feels like we are being watched.  When I start to do her exam she states "do I even have a heart anymore".  Dr. Sharma Covert and Jacki Cones, NP recommend inpatient treatment.  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 No pertinent past medical history.  Past Surgical History:  Procedure Laterality Date  . TONSILLECTOMY      Family History: No family history on file.  Social History:  reports that she has never smoked. She has never used smokeless tobacco. She reports that she does not drink alcohol or use drugs.  Additional Social History:  Alcohol / Drug Use Pain Medications: please see mar Prescriptions: please see mar Over the Counter: please see mar History of alcohol / drug use?: No history of alcohol / drug abuse Longest period of sobriety (when/how long): NA  CIWA: CIWA-Ar BP: (!) 121/94 Pulse Rate: 92 COWS:    Allergies: No Known Allergies  Home Medications:  (Not in a hospital admission)  OB/GYN Status:  No LMP recorded.  General Assessment Data Location of Assessment: [facility] TTS Assessment: In system Is this a  Tele or Face-to-Face Assessment?: Face-to-Face Is this an Initial Assessment or a Re-assessment for this encounter?: Initial Assessment Patient Accompanied by:: N/A Language Other than English: No Living Arrangements: Other (Comment)(unknown) What gender do you identify as?: Female Marital status: Other (comment)(unknown) Maiden name: NA Pregnancy Status: Unable to assess Living Arrangements: Other (Comment)(unknown) Can pt return to current living arrangement?: No Admission Status: Involuntary Petitioner: Other Is patient capable of signing voluntary admission?: No Referral Source: Self/Family/Friend Insurance type: Medicaid     Crisis Care Plan Living Arrangements: Other (Comment)(unknown) Legal Guardian: (NA) Name of Psychiatrist: NA Name of Therapist: NA  Education Status Is patient currently in school?: No Is the patient employed, unemployed or receiving disability?: (unknown)  Risk to self with the past 6 months Suicidal Ideation: No Has patient been a risk to self within the past 6 months prior to admission? : No Suicidal Intent: No Has patient had any suicidal intent within the past 6 months prior to admission? : No Is patient at risk for suicide?: No Suicidal Plan?: No Has patient had any suicidal plan within the past 6 months prior to admission? : No Access to Means: No What has been your use of drugs/alcohol within the last 12 months?: NA Previous Attempts/Gestures: No How many times?: 0 Other Self Harm Risks: NA Triggers for Past Attempts: None known Intentional Self Injurious Behavior: None Family Suicide History: No Recent stressful life event(s): Other (Comment)(unknown)  Persecutory voices/beliefs?: No Depression: No Depression Symptoms: (unknown) Substance abuse history and/or treatment for substance abuse?: No Suicide prevention information given to non-admitted patients: Not applicable  Risk to Others within the past 6 months Homicidal Ideation:  No Does patient have any lifetime risk of violence toward others beyond the six months prior to admission? : No Thoughts of Harm to Others: No Current Homicidal Intent: No Current Homicidal Plan: No Access to Homicidal Means: No Identified Victim: NA History of harm to others?: No Assessment of Violence: None Noted Violent Behavior Description: NA Does patient have access to weapons?: No Criminal Charges Pending?: No Does patient have a court date: No Is patient on probation?: No  Psychosis Hallucinations: None noted Delusions: None noted, Unspecified  Mental Status Report Appearance/Hygiene: Bizarre Eye Contact: Poor Motor Activity: Agitation Speech: Incoherent Level of Consciousness: Combative, Irritable Mood: Angry Affect: Angry Anxiety Level: None Thought Processes: Flight of Ideas, Tangential Judgement: Impaired Orientation: Not oriented Obsessive Compulsive Thoughts/Behaviors: Minimal  Cognitive Functioning Concentration: Decreased Memory: Recent Impaired, Remote Impaired Is patient IDD: No Insight: Poor Impulse Control: Poor Appetite: Fair Have you had any weight changes? : No Change Sleep: Unable to Assess Total Hours of Sleep: 8 Vegetative Symptoms: None  ADLScreening [facility]) Patient's cognitive ability adequate to safely complete daily activities?: Yes Patient able to express need for assistance with ADLs?: Yes Independently performs ADLs?: Yes (appropriate for developmental age)  Prior Inpatient Therapy Prior Inpatient Therapy: Yes Prior Therapy Dates: unknown Prior Therapy Facilty/Provider(s): unknown Reason for Treatment: unknown  Prior Outpatient Therapy Prior Outpatient Therapy: No Does patient have an ACCT team?: No Does patient have Intensive In-House Services?  : No Does patient have [facility] services? : No Does patient have P4CC services?: Unknown  ADL Screening (condition at time of admission) Patient's cognitive  ability adequate to safely complete daily activities?: Yes Is the patient deaf or have difficulty hearing?: No Does the patient have difficulty seeing, even when wearing glasses/contacts?: No Does the patient have difficulty concentrating, remembering, or making decisions?: No Patient able to express need for assistance with ADLs?: Yes Does the patient have difficulty dressing or bathing?: No Independently performs ADLs?: Yes (appropriate for developmental age) Does the patient have difficulty walking or climbing stairs?: No             Advance Directives (For Healthcare) Does Patient Have a Medical Advance Directive?: No Would patient like information on creating a medical advance directive?: No - Patient declined          Disposition:  Disposition Initial Assessment Completed for this Encounter: Yes  On Site Evaluation by:   Reviewed with Physician:    Emmit Pomfret 05/14/2018 9:18 AM

## 2018-05-14 NOTE — ED Notes (Signed)
Patient escorted from bathroom by nursing and security.  Patient had to be restrained physically to receive IM medications.  Patient in bed now crying and moaning.

## 2018-05-14 NOTE — ED Notes (Signed)
Report called to Lafonda Mosses RN at [facility].  Patient has been accepted to the [facility] Unit 246-1.  Dr. Danae Chen is the accepting physician.  Sheriff called for transport.

## 2018-05-14 NOTE — ED Notes (Signed)
Patient walking around the unit extremely confused, and not able to verbalize her needs well.  She asked to take a shower, and when bathing items were obtained for her, she could not carry out the shower procedure even with assistance.  Patient became paranoid when nurse tried to assist her.  Now she is yelling in the hallway.

## 2018-05-14 NOTE — BH Assessment (Signed)
[facility]  Per Juanetta BeetsJacqueline Norman, DO, this pt requires psychiatric hospitalization at this time.  Pt presents under IVC initiated by law enforcement, which EDP Devoria AlbeIva Knapp, MD has upheld.  At 15:29 Thayer OhmChris calls from [facility] to report that pt has been accepted to their facility by Dr Danae ChenLamba to the [facility] unit, Rm 246-1.  EDP Alvira MondayErin Schlossman, MD concurs with this decision.  Pt's nurse, Aram BeechamCynthia, has been notified, and agrees to call report to [phone].  [facility] stipulates that pt must arrive either before 23:00 tonight, or after 06:00 tomorrow.  [facility]  Doylene Canninghomas Simpson Paulos Behavioral Health Coordinator [phone]

## 2018-05-14 NOTE — ED Notes (Signed)
Pt changed into paper scrubs, belongings have been secured. Security present.

## 2018-05-14 NOTE — ED Provider Notes (Signed)
[facility] DEPT Provider Note   CSN: [id number] Arrival date & time: 05/14/18  0024  Time seen 01:40 AM   History   Chief Complaint Chief Complaint  Patient presents with  . IVC  . Depression   Level 5 caveat for psychiatric illness.  HPI Tracie Hogan is a 22 y.o. female.  HPI patient presents via EMS with police in attendance who have started her IVC paperwork.  The officer with her tonight states he saw her 2 days ago when she was walking around [place] asking people for water.  She was also asking people to take her home.  He states he then saw her at a apartment complex and she went in and got her car keys and drove off.  He states tonight a good Samaritan picked her up on a highway and brought her back to [place].  She told the police her car was stuck in a field somewhere.  Please were called when she refused to get out of the good Samaritan's car.  Later on they saw her walking around on the streets and they became concerned and brought her in for evaluation.  They state that she was at [facility] last year.  They do not have a current address on her, the last one given last year was for [facility].  When I asked the patient what happened tonight she states "I do not remember".  She does not seem to know where she is although she did know she was in [place].  When I asked her where she lives she states "in this bed".  I asked her how she spends her time and she states "trying to help people" and then correcting herself and saying she is trying to "heal" people.  When I asked her how she heals people she states "by sacrificing my natural energy".  She states she has not been sleeping but "feels like I have been".  She admits to hearing voices and feels like we are being watched.  When I start to do her exam she states "do I even have a heart anymore".  PCP Patient, No Pcp Per   History reviewed. No pertinent past medical  history.  Patient Active Problem List   Diagnosis Date Noted  . Cannabis abuse with psychotic disorder, with delusions (HCC) 08/17/2017  . Brief reactive psychosis with marked stressor (HCC) 08/17/2017    Past Surgical History:  Procedure Laterality Date  . TONSILLECTOMY       OB History   None      Home Medications    Prior to Admission medications   Medication Sig Start Date End Date Taking? Authorizing Provider  metroNIDAZOLE (FLAGYL) 500 MG tablet Take 1 tablet (500 mg total) by mouth 2 (two) times daily. Patient not taking: Reported on 08/16/2017 07/15/15   Hassan Rowan, MD  sulfamethoxazole-trimethoprim (BACTRIM DS,SEPTRA DS) 800-160 MG tablet Take 1 tablet by mouth 2 (two) times daily. Patient not taking: Reported on 08/16/2017 02/07/16   Marny Lowenstein, PA-C    Family History No family history on file.  Social History Social History   Tobacco Use  . Smoking status: Never Smoker  . Smokeless tobacco: Never Used  Substance Use Topics  . Alcohol use: No  . Drug use: No     Allergies   Patient has no known allergies.   Review of Systems Review of Systems  Unable to perform ROS: Psychiatric disorder     Physical Exam Updated  Vital Signs BP 122/85 (BP Location: Left Arm)   Pulse 74   Temp 98.7 F (37.1 C) (Oral)   Resp 18   SpO2 100%   Vital signs normal    Physical Exam  Constitutional: She is oriented to person, place, and time. She appears well-developed and well-nourished.  Non-toxic appearance. She does not appear ill. No distress.  HENT:  Head: Normocephalic and atraumatic.  Right Ear: External ear normal.  Left Ear: External ear normal.  Nose: Nose normal. No mucosal edema or rhinorrhea.  Mouth/Throat: Oropharynx is clear and moist and mucous membranes are normal. No dental abscesses or uvula swelling.  Eyes: Pupils are equal, round, and reactive to light. Conjunctivae and EOM are normal.  Neck: Normal range of motion and full  passive range of motion without pain. Neck supple.  Cardiovascular: Normal rate, regular rhythm and normal heart sounds. Exam reveals no gallop and no friction rub.  No murmur heard. Pulmonary/Chest: Effort normal and breath sounds normal. No respiratory distress. She has no wheezes. She has no rhonchi. She has no rales. She exhibits no tenderness and no crepitus.  Abdominal: Soft. Normal appearance and bowel sounds are normal. She exhibits no distension. There is no tenderness. There is no rebound and no guarding.  Musculoskeletal: Normal range of motion. She exhibits no edema or tenderness.  Moves all extremities well.   Neurological: She is alert and oriented to person, place, and time. She has normal strength. No cranial nerve deficit.  Skin: Skin is warm, dry and intact. No rash noted. No erythema. No pallor.  Psychiatric: Her affect is inappropriate. Her speech is delayed. She is slowed and withdrawn.  Patient has very odd affect.  While we are in the room she states she feels like were being watched.  She intermittently grabs herself and ask like she is having pain but she will not tell me if she is having pain.  She at times appears to be listening to voices.  At one point during her physical exam she looked at me and I could tell she was seeing something else.  Nursing note and vitals reviewed.    ED Treatments / Results  Labs (all labs ordered are listed, but only abnormal results are displayed)  Results for orders placed or performed during the hospital encounter of 05/14/18  Comprehensive metabolic panel  Result Value Ref Range   Sodium 141 135 - 145 mmol/L   Potassium 3.2 (L) 3.5 - 5.1 mmol/L   Chloride 104 98 - 111 mmol/L   CO2 23 22 - 32 mmol/L   Glucose, Bld 89 70 - 99 mg/dL   BUN 8 6 - 20 mg/dL   Creatinine, Ser 8.11 0.44 - 1.00 mg/dL   Calcium 91.4 8.9 - 78.2 mg/dL   Total Protein 8.2 (H) 6.5 - 8.1 g/dL   Albumin 5.0 3.5 - 5.0 g/dL   AST 21 15 - 41 U/L   ALT 12 0 -  44 U/L   Alkaline Phosphatase 50 38 - 126 U/L   Total Bilirubin 1.5 (H) 0.3 - 1.2 mg/dL   GFR calc non Af Amer >60 >60 mL/min   GFR calc Af Amer >60 >60 mL/min   Anion gap 14 5 - 15  Ethanol  Result Value Ref Range   Alcohol, Ethyl (B) <10 <10 mg/dL  Salicylate level  Result Value Ref Range   Salicylate Lvl <7.0 2.8 - 30.0 mg/dL  Acetaminophen level  Result Value Ref Range   Acetaminophen (Tylenol),  Serum <10 (L) 10 - 30 ug/mL  cbc  Result Value Ref Range   WBC 7.9 4.0 - 10.5 K/uL   RBC 4.41 3.87 - 5.11 MIL/uL   Hemoglobin 14.4 12.0 - 15.0 g/dL   HCT 86.5 78.4 - 69.6 %   MCV 95.9 78.0 - 100.0 fL   MCH 32.7 26.0 - 34.0 pg   MCHC 34.0 30.0 - 36.0 g/dL   RDW 29.5 28.4 - 13.2 %   Platelets 377 150 - 400 K/uL  Rapid urine drug screen (hospital performed)  Result Value Ref Range   Opiates NONE DETECTED NONE DETECTED   Cocaine NONE DETECTED NONE DETECTED   Benzodiazepines NONE DETECTED NONE DETECTED   Amphetamines NONE DETECTED NONE DETECTED   Tetrahydrocannabinol POSITIVE (A) NONE DETECTED   Barbiturates NONE DETECTED NONE DETECTED  hCG, serum, qualitative  Result Value Ref Range   Preg, Serum NEGATIVE NEGATIVE   Laboratory interpretation all normal except + UDS for marijuana, hypokalemia   EKG None  Radiology No results found.  Procedures Procedures (including critical care time)      Medications Ordered in ED Medications  lamoTRIgine (LAMICTAL) tablet 25 mg (25 mg Oral Given 05/14/18 0311)  traZODone (DESYREL) tablet 50 mg (50 mg Oral Given 05/14/18 0311)  risperiDONE (RISPERDAL) tablet 0.5 mg (0.5 mg Oral Given 05/14/18 0240)  potassium chloride SA (K-DUR,KLOR-CON) CR tablet 40 mEq (has no administration in time range)     Initial Impression / Assessment and Plan / ED Course  I have reviewed the triage vital signs and the nursing notes.  Pertinent labs & imaging results that were available during my care of the patient were reviewed by me and considered in  my medical decision making (see chart for details).     Review in care everywhere shows patient was admitted at [facility] in January 2019 with diagnosis of bipolar 1 disorder, single manic episode, severe with psychotic features.  And a secondary diagnosis of marijuana abuse.  She was discharged from the hospital on gabapentin 300 mg 3 times a day, Lamictal 25 mg once a day, Risperdal 1 mg twice a day, and trazodone 50 mg at bedtime.  The second opinion or examination and recommendation to determine necessity for  involuntary commitment was filled out by myself.  TTS consult ordered 06:19 AM  Patient was noted to have a mild hypokalemia, she was placed on oral potassium for 3 doses.  Final Clinical Impressions(s) / ED Diagnoses   Final diagnoses:  Bipolar 1 disorder (HCC)  Psychosis, unspecified psychosis type (HCC)  Hypokalemia    Plan inpatient psychiatric admission  Devoria Albe, MD, Concha Pyo, MD 05/14/18 [phone]

## 2018-05-14 NOTE — ED Notes (Signed)
Bed: OZ30 Expected date:  Expected time:  Means of arrival:  Comments: EMS 22 yo catatonic-walking in traffic-[facility] declined patient-IVC being done

## 2018-05-14 NOTE — Progress Notes (Signed)
Pharmacy is unable to confirm if she was taking any medications prior to admission. The listed pharmacy, [facility] has not filled anything for > 6 months. They show previous prescriptions included Lamictal, Trazodone, Gabapentin, and Risperidone.  Let pharmacy know if we can be of further assistance.  Charolotte Eke, PharmD. Mobile: [phone]. 05/14/2018,1:03 PM.

## 2018-05-14 NOTE — ED Notes (Signed)
Pt has difficulty following directions, pt reports she is sleepy from the 3 pills she was given in the ED.  Awake, alert & responsive, no distress noted.  Pt confused and paranoid.  Monitoring for safety, Q 15 min checks in effect.
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
## 2018-05-14 NOTE — ED Notes (Signed)
Security present to wand patient and transport to room 35 in [facility]

## 2018-05-14 NOTE — ED Notes (Addendum)
Patient only took one half of one potassium tablet.  She refused the rest.

## 2018-07-21 ENCOUNTER — Emergency Department (HOSPITAL_COMMUNITY)
Admission: EM | Admit: 2018-07-21 | Discharge: 2018-07-21 | Disposition: A | Discharge status: Home / Self Care | Payer: Medicaid Other | Attending: Emergency Medicine | Admitting: Emergency Medicine

## 2018-07-21 ENCOUNTER — Encounter (HOSPITAL_COMMUNITY): Payer: Self-pay

## 2018-07-21 DIAGNOSIS — Z1889 Other specified retained foreign body fragments: Secondary | ICD-10-CM | POA: Diagnosis not present

## 2018-07-21 DIAGNOSIS — Y999 Unspecified external cause status: Secondary | ICD-10-CM | POA: Insufficient documentation

## 2018-07-21 DIAGNOSIS — X58XXXA Exposure to other specified factors, initial encounter: Secondary | ICD-10-CM | POA: Diagnosis not present

## 2018-07-21 DIAGNOSIS — Y929 Unspecified place or not applicable: Secondary | ICD-10-CM | POA: Diagnosis not present

## 2018-07-21 DIAGNOSIS — S09301A Unspecified injury of right middle and inner ear, initial encounter: Secondary | ICD-10-CM | POA: Diagnosis present

## 2018-07-21 DIAGNOSIS — T161XXA Foreign body in right ear, initial encounter: Secondary | ICD-10-CM | POA: Diagnosis not present

## 2018-07-21 DIAGNOSIS — Y939 Activity, unspecified: Secondary | ICD-10-CM | POA: Diagnosis not present

## 2018-07-21 DIAGNOSIS — T169XXA Foreign body in ear, unspecified ear, initial encounter: Secondary | ICD-10-CM

## 2018-07-21 NOTE — ED Notes (Signed)
Attempted to irrigate pts ears and she pulled away and cried ,  Got tiny wad of tissue  Out pt s rt ear and she cried out when I attempted to do left ear dr Anitra LauthPlunkett went  Back in there and got out some wax

## 2018-07-21 NOTE — ED Provider Notes (Signed)
[facility] EMERGENCY [facility] Provider Note   CSN: [id number] Arrival date & time: 07/21/18  1405     History   Chief Complaint Chief Complaint  Patient presents with  . Foreign Body in Ear    HPI Tracie Hogan is a 22 y.o. female.  The history is provided by the patient.  Foreign Body in Ear  This is a chronic problem. Episode onset: over a month ago. The problem occurs constantly. The problem has not changed since onset.Associated symptoms comments: Some mild ear pain but pt put toilet paper in her ears so she could sleep at night but now unable ot get it out and having difficulty hearing.. Nothing aggravates the symptoms. Nothing relieves the symptoms. She has tried nothing for the symptoms. The treatment provided no relief.    History reviewed. No pertinent past medical history.  Patient Active Problem List   Diagnosis Date Noted  . Cannabis abuse with psychotic disorder, with delusions (HCC) 08/17/2017  . Brief reactive psychosis with marked stressor (HCC) 08/17/2017    Past Surgical History:  Procedure Laterality Date  . TONSILLECTOMY       OB History   None      Home Medications    Prior to Admission medications   Not on File    Family History No family history on file.  Social History Social History   Tobacco Use  . Smoking status: Never Smoker  . Smokeless tobacco: Never Used  Substance Use Topics  . Alcohol use: No  . Drug use: No     Allergies   Patient has no known allergies.   Review of Systems Review of Systems  All other systems reviewed and are negative.    Physical Exam Updated Vital Signs BP 101/77 (BP Location: Right Arm)   Pulse 68   Temp (!) 97.5 F (36.4 C) (Oral)   Resp 16   SpO2 100%   Physical Exam  Constitutional: She is oriented to person, place, and time. She appears well-developed and well-nourished. No distress.  HENT:  Head: Normocephalic and atraumatic.  Right Ear: A foreign  body is present.  Left Ear: A foreign body is present.  Toilet paper present in bilateral ear canals.  After toilet paper removed normal TM's  Eyes: Pupils are equal, round, and reactive to light.  Cardiovascular: Normal rate.  Pulmonary/Chest: Effort normal.  Neurological: She is alert and oriented to person, place, and time.  Skin: Skin is warm and dry.  Psychiatric: She has a normal mood and affect. Her behavior is normal.  Nursing note and vitals reviewed.    ED Treatments / Results  Labs (all labs ordered are listed, but only abnormal results are displayed) Labs Reviewed - No data to display  EKG None  Radiology No results found.  Procedures .Foreign Body Removal Date/Time: 07/21/2018 3:56 PM Performed by: Gwyneth SproutPlunkett, Raheel Kunkle, MD Authorized by: Gwyneth SproutPlunkett, Amina Menchaca, MD  Consent: Verbal consent obtained. Consent given by: patient Patient understanding: patient states understanding of the procedure being performed Patient identity confirmed: verbally with patient Body area: ear Location details: right ear  Sedation: Patient sedated: no  Patient restrained: no Localization method: ENT speculum Removal mechanism: ear scoop Complexity: simple 1 objects recovered. Objects recovered: toilet paper removed from both ear canals Post-procedure assessment: foreign body removed Patient tolerance: Patient tolerated the procedure well with no immediate complications   (including critical care time)  Medications Ordered in ED Medications - No data to display   Initial Impression /  Assessment and Plan / ED Course  I have reviewed the triage vital signs and the nursing notes.  Pertinent labs & imaging results that were available during my care of the patient were reviewed by me and considered in my medical decision making (see chart for details).     Patient presenting with bilateral foreign bodies of the ear canal from toilet paper.  Removal as above.  Patient is now  hearing better and TMs are normal.  Final Clinical Impressions(s) / ED Diagnoses   Final diagnoses:  Foreign body in ear, unspecified laterality, initial encounter    ED Discharge Orders    None       Gwyneth Sprout, MD 07/21/18 1557

## 2018-07-21 NOTE — ED Triage Notes (Signed)
Pt presents for evaluation of tissue in ear x 1 month. Pt reports hearing is muffled and has some discomfort.

## 2018-10-17 ENCOUNTER — Emergency Department (HOSPITAL_COMMUNITY)
Admission: EM | Admit: 2018-10-17 | Discharge: 2018-10-18 | Disposition: A | Discharge status: Other Acute Inpatient Hospital | Payer: Medicaid Other | Attending: Emergency Medicine | Admitting: Emergency Medicine

## 2018-10-17 ENCOUNTER — Encounter (HOSPITAL_COMMUNITY): Payer: Self-pay

## 2018-10-17 ENCOUNTER — Emergency Department (HOSPITAL_COMMUNITY): Payer: Medicaid Other

## 2018-10-17 ENCOUNTER — Other Ambulatory Visit: Payer: Self-pay

## 2018-10-17 DIAGNOSIS — F29 Unspecified psychosis not due to a substance or known physiological condition: Secondary | ICD-10-CM | POA: Insufficient documentation

## 2018-10-17 DIAGNOSIS — R4689 Other symptoms and signs involving appearance and behavior: Secondary | ICD-10-CM | POA: Diagnosis not present

## 2018-10-17 DIAGNOSIS — R440 Auditory hallucinations: Secondary | ICD-10-CM | POA: Insufficient documentation

## 2018-10-17 DIAGNOSIS — Z008 Encounter for other general examination: Secondary | ICD-10-CM

## 2018-10-17 DIAGNOSIS — Z041 Encounter for examination and observation following transport accident: Secondary | ICD-10-CM | POA: Insufficient documentation

## 2018-10-17 DIAGNOSIS — R000 Tachycardia, unspecified: Secondary | ICD-10-CM | POA: Insufficient documentation

## 2018-10-17 DIAGNOSIS — F319 Bipolar disorder, unspecified: Secondary | ICD-10-CM | POA: Insufficient documentation

## 2018-10-17 DIAGNOSIS — Z046 Encounter for general psychiatric examination, requested by authority: Secondary | ICD-10-CM

## 2018-10-17 DIAGNOSIS — F12151 Cannabis abuse with psychotic disorder with hallucinations: Secondary | ICD-10-CM | POA: Diagnosis not present

## 2018-10-17 DIAGNOSIS — R441 Visual hallucinations: Secondary | ICD-10-CM | POA: Insufficient documentation

## 2018-10-17 LAB — CBC
HEMATOCRIT: 45.2 % (ref 36.0–46.0)
Hemoglobin: 14.5 g/dL (ref 12.0–15.0)
MCH: 31.2 pg (ref 26.0–34.0)
MCHC: 32.1 g/dL (ref 30.0–36.0)
MCV: 97.2 fL (ref 80.0–100.0)
NRBC: 0 % (ref 0.0–0.2)
Platelets: 434 10*3/uL — ABNORMAL HIGH (ref 150–400)
RBC: 4.65 MIL/uL (ref 3.87–5.11)
RDW: 13.2 % (ref 11.5–15.5)
WBC: 10.4 10*3/uL (ref 4.0–10.5)

## 2018-10-17 LAB — COMPREHENSIVE METABOLIC PANEL
ALBUMIN: 4.4 g/dL (ref 3.5–5.0)
ALT: 9 U/L (ref 0–44)
AST: 30 U/L (ref 15–41)
Alkaline Phosphatase: 51 U/L (ref 38–126)
Anion gap: 16 — ABNORMAL HIGH (ref 5–15)
CHLORIDE: 100 mmol/L (ref 98–111)
CO2: 21 mmol/L — AB (ref 22–32)
CREATININE: 0.94 mg/dL (ref 0.44–1.00)
Calcium: 10.2 mg/dL (ref 8.9–10.3)
GFR calc non Af Amer: >60 mL/min (ref >60)
GLUCOSE: 104 mg/dL — AB (ref 70–99)
Potassium: 3.3 mmol/L — ABNORMAL LOW (ref 3.5–5.1)
SODIUM: 137 mmol/L (ref 135–145)
Total Bilirubin: 1.4 mg/dL — ABNORMAL HIGH (ref 0.3–1.2)
Total Protein: 8.5 g/dL — ABNORMAL HIGH (ref 6.5–8.1)

## 2018-10-17 LAB — CDS SEROLOGY

## 2018-10-17 LAB — PROTIME-INR
INR: 1 (ref 0.8–1.2)
Prothrombin Time: 13.5 seconds (ref 11.4–15.2)

## 2018-10-17 LAB — RAPID URINE DRUG SCREEN, HOSP PERFORMED
Amphetamines: NOT DETECTED
Barbiturates: NOT DETECTED
Benzodiazepines: POSITIVE — AB
Cocaine: NOT DETECTED
Opiates: NOT DETECTED
Tetrahydrocannabinol: POSITIVE — AB

## 2018-10-17 LAB — ETHANOL: Alcohol, Ethyl (B): <10 mg/dL (ref <10)

## 2018-10-17 LAB — SAMPLE TO BLOOD BANK

## 2018-10-17 LAB — I-STAT BETA HCG BLOOD, ED (MC, WL, AP ONLY): I-stat hCG, quantitative: <5 m[IU]/mL (ref <5)

## 2018-10-17 LAB — ACETAMINOPHEN LEVEL: Acetaminophen (Tylenol), Serum: <10 ug/mL — ABNORMAL LOW (ref 10–30)

## 2018-10-17 LAB — SALICYLATE LEVEL

## 2018-10-17 LAB — LACTIC ACID, PLASMA: Lactic Acid, Venous: 1.5 mmol/L (ref 0.5–1.9)

## 2018-10-17 MED ORDER — ALUM & MAG HYDROXIDE-SIMETH 200-200-20 MG/5ML PO SUSP: 30.0000 mL | Freq: Four times a day (QID) | ORAL | Status: DC | PRN

## 2018-10-17 MED ORDER — RISPERIDONE 2 MG PO TBDP
2.0000 mg | ORAL_TABLET | Freq: Three times a day (TID) | ORAL | Status: DC | PRN
  Filled 2018-10-17: qty 1

## 2018-10-17 MED ORDER — OLANZAPINE 5 MG PO TABS: 5.0000 mg | ORAL_TABLET | Freq: Two times a day (BID) | ORAL | Status: DC

## 2018-10-17 MED ORDER — NICOTINE 21 MG/24HR TD PT24
21.0000 mg | MEDICATED_PATCH | Freq: Every day | TRANSDERMAL | Status: DC
  Administered 2018-10-17: 21 mg via TRANSDERMAL
  Filled 2018-10-17: qty 1

## 2018-10-17 MED ORDER — ZOLPIDEM TARTRATE 5 MG PO TABS
5.0000 mg | ORAL_TABLET | Freq: Every evening | ORAL | Status: DC | PRN
  Filled 2018-10-17: qty 1

## 2018-10-17 MED ORDER — ONDANSETRON HCL 4 MG PO TABS: 4.0000 mg | ORAL_TABLET | Freq: Three times a day (TID) | ORAL | Status: DC | PRN

## 2018-10-17 MED ORDER — OLANZAPINE 5 MG PO TBDP
5.0000 mg | ORAL_TABLET | Freq: Two times a day (BID) | ORAL | Status: DC
  Administered 2018-10-18: 5 mg via ORAL
  Filled 2018-10-17 (×2): qty 1

## 2018-10-17 MED ORDER — ZIPRASIDONE MESYLATE 20 MG IM SOLR: 20.0000 mg | INTRAMUSCULAR | Status: DC | PRN

## 2018-10-17 MED ORDER — IBUPROFEN 400 MG PO TABS: 600.0000 mg | ORAL_TABLET | Freq: Three times a day (TID) | ORAL | Status: DC | PRN

## 2018-10-17 MED ORDER — LORAZEPAM 1 MG PO TABS
1.0000 mg | ORAL_TABLET | ORAL | Status: AC | PRN
  Administered 2018-10-18: 1 mg via ORAL
  Filled 2018-10-17: qty 1

## 2018-10-17 NOTE — ED Notes (Signed)
Pt out to nursing station, attempted to walk out doors. Stopped by Comptroller. Pt standing at desk, staring at this RN. Will not respond to any questions. Walked back to room where pt is just sitting on bed. Tried to talk to pt, but pt will not look at me or respond. No distress noted. Resp even and non-labored. Will continue to monitor. Sitter at bedside.
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
## 2018-10-17 NOTE — BH Assessment (Addendum)
[facility] Assessment Progress Note    TTS attempted to assess patient, but she would not talk to this Clinical research associate.  Patient has no emergency contact listed that could provide collateral information.  TTS contacted [facility] at [phone] to go to patient's home to try to make contact with patient's family and have them to call this Clinical research associate.

## 2018-10-17 NOTE — Progress Notes (Signed)
Pt meets inpatient criteria per Reola Calkins, NP. Referral information has been sent to the following hospitals for review:  [facility]  [facility]  [facility]  [facility]  [facility]  [facility]  [facility]  [facility]   Disposition will continue to assist with inpatient placement needs.   Wells Guiles, LCSW, LCAS Disposition CSW [facility]/TTS [phone]
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
## 2018-10-17 NOTE — BH Assessment (Signed)
Clinician contacted pt's nurse in an attempt to complete BH Assessment. Pt's nurse stated pt is currently changing into scrubs and they are in the process of getting ready to move pt to another room. Pt's nurse requested to call clinician back when they are ready for assessment; clinician provided her phone number.

## 2018-10-17 NOTE — ED Notes (Signed)
Pt is nonverbal at this time.

## 2018-10-18 ENCOUNTER — Inpatient Hospital Stay (HOSPITAL_COMMUNITY)
Admission: AD | Admit: 2018-10-18 | Discharge: 2018-10-23 | DRG: 885 | Disposition: A | Discharge status: Home / Self Care | Payer: Medicaid Other | Source: Intra-hospital | Attending: Psychiatry | Admitting: Psychiatry

## 2018-10-18 ENCOUNTER — Other Ambulatory Visit: Payer: Self-pay

## 2018-10-18 ENCOUNTER — Encounter (HOSPITAL_COMMUNITY): Payer: Self-pay

## 2018-10-18 DIAGNOSIS — F250 Schizoaffective disorder, bipolar type: Secondary | ICD-10-CM | POA: Diagnosis not present

## 2018-10-18 DIAGNOSIS — F259 Schizoaffective disorder, unspecified: Secondary | ICD-10-CM | POA: Diagnosis present

## 2018-10-18 DIAGNOSIS — Z9119 Patient's noncompliance with other medical treatment and regimen: Secondary | ICD-10-CM | POA: Diagnosis not present

## 2018-10-18 DIAGNOSIS — F319 Bipolar disorder, unspecified: Secondary | ICD-10-CM | POA: Diagnosis not present

## 2018-10-18 MED ORDER — ACETAMINOPHEN 325 MG PO TABS: 650.0000 mg | ORAL_TABLET | Freq: Four times a day (QID) | ORAL | Status: DC | PRN

## 2018-10-18 MED ORDER — OLANZAPINE 5 MG PO TABS
5.0000 mg | ORAL_TABLET | Freq: Two times a day (BID) | ORAL | Status: DC
  Filled 2018-10-18 (×2): qty 1

## 2018-10-18 MED ORDER — NICOTINE 21 MG/24HR TD PT24
21.0000 mg | MEDICATED_PATCH | Freq: Every day | TRANSDERMAL | Status: DC
  Administered 2018-10-18: 21 mg via TRANSDERMAL
  Filled 2018-10-18 (×9): qty 1

## 2018-10-18 MED ORDER — HYDROXYZINE HCL 25 MG PO TABS
25.0000 mg | ORAL_TABLET | Freq: Three times a day (TID) | ORAL | Status: DC | PRN
  Administered 2018-10-22 – 2018-10-23 (×3): 25 mg via ORAL
  Filled 2018-10-18: qty 1
  Filled 2018-10-18: qty 4
  Filled 2018-10-18 (×3): qty 1

## 2018-10-18 MED ORDER — LORAZEPAM 1 MG PO TABS
1.0000 mg | ORAL_TABLET | ORAL | Status: DC | PRN
  Filled 2018-10-18: qty 1

## 2018-10-18 MED ORDER — MAGNESIUM HYDROXIDE 400 MG/5ML PO SUSP: 30.0000 mL | Freq: Every day | ORAL | Status: DC | PRN

## 2018-10-18 MED ORDER — LORAZEPAM 1 MG PO TABS: 1.0000 mg | ORAL_TABLET | ORAL | Status: DC | PRN

## 2018-10-18 MED ORDER — IBUPROFEN 600 MG PO TABS: 600.0000 mg | ORAL_TABLET | Freq: Three times a day (TID) | ORAL | Status: DC | PRN

## 2018-10-18 MED ORDER — ALUM & MAG HYDROXIDE-SIMETH 200-200-20 MG/5ML PO SUSP
30.0000 mL | ORAL | Status: DC | PRN
  Administered 2018-10-21: 30 mL via ORAL
  Filled 2018-10-18: qty 30

## 2018-10-18 MED ORDER — TRAZODONE HCL 50 MG PO TABS
50.0000 mg | ORAL_TABLET | Freq: Every evening | ORAL | Status: DC | PRN
  Administered 2018-10-19 – 2018-10-22 (×4): 50 mg via ORAL
  Filled 2018-10-18: qty 3
  Filled 2018-10-18 (×3): qty 1

## 2018-10-18 MED ORDER — OLANZAPINE 5 MG PO TBDP
5.0000 mg | ORAL_TABLET | Freq: Three times a day (TID) | ORAL | Status: DC | PRN
  Filled 2018-10-18: qty 1

## 2018-10-18 MED ORDER — OLANZAPINE 5 MG PO TBDP
5.0000 mg | ORAL_TABLET | Freq: Once | ORAL | Status: AC
  Administered 2018-10-18: 5 mg via ORAL
  Filled 2018-10-18 (×2): qty 1

## 2018-10-18 MED ORDER — OLANZAPINE 5 MG PO TBDP
15.0000 mg | ORAL_TABLET | Freq: Every day | ORAL | Status: DC
  Administered 2018-10-18: 15 mg via ORAL
  Filled 2018-10-18 (×2): qty 3

## 2018-10-18 MED ORDER — ZIPRASIDONE MESYLATE 20 MG IM SOLR: 20.0000 mg | INTRAMUSCULAR | Status: DC | PRN

## 2018-10-18 NOTE — BHH Counselor (Signed)
Clinical Social Work Note  CSW attempted to do Psychosocial Assessment with patient, but she was sleeping soundly.  She woke up long enough to request it be done tomorrow.  Ambrose Mantle, LCSW 10/18/2018, 3:35 PM
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
## 2018-10-18 NOTE — ED Notes (Signed)
Service response contacted, lunch tray already ordered for pt 
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
## 2018-10-18 NOTE — ED Notes (Signed)
Advised pt she has been accepted to [facility] - pt nodded her head - barely opened her eyes.

## 2018-10-18 NOTE — ED Notes (Signed)
Pt standing in doorway of room. Sitter has computer at [employer]. Pt just standing and staring off towards double doors. Will not respond to questions.

## 2018-10-18 NOTE — ED Notes (Signed)
Pt accepted to [facility] - 503-1.

## 2018-10-18 NOTE — BH Assessment (Signed)
Reassessment: Pt was lying on her bed with her eyes open, but did not look at the tele assessment machine and was unable to respond to any questions. Her breakfast appeared to be untouched on the tray beside her bed.  TTS continues to seek placement.

## 2018-10-18 NOTE — Progress Notes (Signed)
Per Steffanie Rainwater, pt has been accepted to [facility] bed 503-1. Accepting provider is Reola Calkins, NP. Attending provider is Dr. Jeannine Kitten. Patient can arrive when IVC transport. Number for report is [phone]. AC spoke with Kriste Basque, RN regarding disposition.   Vilma Meckel. Algis Greenhouse, MSW, LCSW Clinical Social Work/Disposition Phone: [phone] Fax: [phone]
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
## 2018-10-18 NOTE — ED Notes (Signed)
Attempted to call report x2

## 2018-10-18 NOTE — ED Notes (Signed)
IVC copy made and placed in drawer for medical records. Four copies made for chart and copy faxed to [facility]. Original placed in red folder in pyxis room.
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
## 2018-10-18 NOTE — ED Notes (Signed)
ALL belongings - 1 labeled belongings bag and 1 valuables envelope - LEO.

## 2018-10-18 NOTE — ED Notes (Signed)
Pt has been sitting on the side of the bed, shaking her leg with her eyes closed. Will not respond to any questions or her name.

## 2018-10-18 NOTE — ED Notes (Signed)
Breakfast Tray Ordered. 
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
## 2018-10-18 NOTE — ED Notes (Signed)
Woke pt so may eat breakfast - shook her head.

## 2018-10-18 NOTE — ED Notes (Signed)
Pt took meds w/assistance from RN. Pt held med cup in her hand until after much encouragement to place meds in her mouth. Pt drank 1/2 cup of water. Pt declined additional water and food offered.

## 2018-10-18 NOTE — ED Notes (Signed)
Attempted to call report [facility]. Advised will need to call back in approx 15 min.

## 2018-10-19 DIAGNOSIS — F250 Schizoaffective disorder, bipolar type: Principal | ICD-10-CM

## 2018-10-19 MED ORDER — CLONAZEPAM 1 MG PO TABS
1.0000 mg | ORAL_TABLET | Freq: Every day | ORAL | Status: DC
  Administered 2018-10-20 – 2018-10-22 (×4): 1 mg via ORAL
  Filled 2018-10-19 (×4): qty 1

## 2018-10-19 MED ORDER — BENZTROPINE MESYLATE 0.5 MG PO TABS
0.5000 mg | ORAL_TABLET | Freq: Two times a day (BID) | ORAL | Status: DC
  Administered 2018-10-22 – 2018-10-23 (×3): 0.5 mg via ORAL
  Filled 2018-10-19: qty 14
  Filled 2018-10-19 (×2): qty 1
  Filled 2018-10-19: qty 14
  Filled 2018-10-19 (×9): qty 1

## 2018-10-19 MED ORDER — RISPERIDONE 3 MG PO TABS
3.0000 mg | ORAL_TABLET | Freq: Every day | ORAL | Status: DC
  Filled 2018-10-19 (×2): qty 1

## 2018-10-19 MED ORDER — LITHIUM CARBONATE 150 MG PO CAPS
150.0000 mg | ORAL_CAPSULE | Freq: Two times a day (BID) | ORAL | Status: DC
  Administered 2018-10-22 – 2018-10-23 (×3): 150 mg via ORAL
  Filled 2018-10-19 (×12): qty 1
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
## 2018-10-19 NOTE — Plan of Care (Signed)
D: Patient in hallway on approach. Patient is alert and cooperative. Denies SI, HI, AVH, and verbally contracts for safety. Patient is guarded and forwards little information to this RN. Motor activity is slow and speech is soft and slow.  Environmental checks revealed a teddy bear in the patients room. This RN informed the patient that the teddy bear had to be placed in the patient's locker as it was not allowed on the unit. At first the patient refused but shaking her head no, then said ok but wanted to go to the locker herself. After much encouragement the patient handed this RN the teddy bear. Remigio Eisenmenger bear searched, no contraband found.    A: Scheduled medications administered per MD order. Support provided. Patient educated on safety on the unit and medications. Routine safety checks every 15 minutes. Patient stated understanding to tell nurse about any new physical symptoms. Patient understands to tell staff of any needs.     R: No adverse drug reactions noted. Patient verbally contracts for safety. Patient remains safe at this time and will continue to monitor.   Problem: Education: Goal: Knowledge of [facility] Education information/materials will improve Outcome: Progressing   Problem: Safety: Goal: Periods of time without injury will increase Outcome: Progressing   Patient oriented to the unit. Patient remains safe and will continue to monitor.

## 2018-10-19 NOTE — BHH Group Notes (Signed)
[facility] LCSW Group Therapy Note  Date/Time:  10/19/2018  11:00AM-12:00PM  Type of Therapy and Topic:  Group Therapy:  Music and Mood  Participation Level:  Active   Description of Group: In this process group, members listened to a variety of genres of music and identified that different types of music evoke different responses.  Patients were encouraged to identify music that was soothing for them and music that was energizing for them.  Patients discussed how this knowledge can help with wellness and recovery in various ways including managing depression and anxiety as well as encouraging healthy sleep habits.    Therapeutic Goals: 1. Patients will explore the impact of different varieties of music on mood 2. Patients will verbalize the thoughts they have when listening to different types of music 3. Patients will identify music that is soothing to them as well as music that is energizing to them 4. Patients will discuss how to use this knowledge to assist in maintaining wellness and recovery 5. Patients will explore the use of music as a coping skill  Summary of Patient Progress:  At the beginning of group, patient expressed that she felt "connected."  She talked very little in group, played with her hair most of the group but did respond when called on directly to identify her feelings.  At the end of group she said she felt "good."  Therapeutic Modalities: Solution Focused Brief Therapy Activity   Ambrose Mantle, LCSW

## 2018-10-19 NOTE — Progress Notes (Addendum)
Patient ID: Tracie Hogan, female   DOB: October 21, 1995, 23 y.o.   MRN: [id number]  Nursing Progress Note [id number]  Per [facility] while conducting checks, patient was discovered in her room completely naked eating breakfast in bed. Patient was encouraged to put on clothes and was compliant with staff requests. Patient came to the nurses's station and did complete her self-inventory sheet. Patient presents guarded during interactions with poor eye contact . Patient appears blunted, irritable and withdrawn. Patient currently denies SI/HI/AVH. Patient did request staff print song lyrics of her favorite song.  Patient is educated about and provided medication per provider's orders. Patient safety maintained with q15 min safety checks and low fall risk precautions. Emotional support given, 1:1 interaction, and active listening provided. Patient encouraged to attend meals, groups, and work on treatment plan and goals. Labs, vital signs and patient behavior monitored throughout shift.   Patient contracts for safety with staff. Will continue to support and monitor.   Patient's self-inventory sheet Rated Energy Level  Low  Rated Sleep  Good  Rated Appetite  Good  Rated Anxiety (0-10)  0  Rated Hopelessness (0-10)  0  Rated Depression (0-10)  3  Daily Goal  "control anger and follow directions"  Any Additional Comments:

## 2018-10-19 NOTE — Plan of Care (Signed)
  Problem: Education: Goal: Knowledge of [place] [facility] Education information/materials will improve Outcome: Progressing   Problem: Safety: Goal: Periods of time without injury will increase Outcome: Progressing   

## 2018-10-19 NOTE — B[facility] Suicide Risk Assessment (Signed)
[facility] INPATIENT:  Family/Significant Other Suicide Prevention Education  Suicide Prevention Education:  Patient Refusal for Family/Significant Other Suicide Prevention Education: The patient Tracie Hogan has refused to provide written consent for family/significant other to be provided Family/Significant Other Suicide Prevention Education during admission and/or prior to discharge.  Physician notified.  Carloyn Jaeger Grossman-Orr 10/19/2018, 5:03 PM

## 2018-10-19 NOTE — Progress Notes (Signed)
Patient ID: Tracie Hogan, female   DOB: December 18, 1995, 23 y.o.   MRN: [id number]  Patient states she has been on Lithium before but is refusing her 1700 scheduled dose stating, "I don't need them". Patient educated about benefits of medication and encouraged to comply with treatment plan to work towards discharge. Patient continued to refuse and grew irritable with Clinical research associate.

## 2018-10-20 MED ORDER — RISPERIDONE 2 MG PO TABS
4.0000 mg | ORAL_TABLET | Freq: Every day | ORAL | Status: DC
  Administered 2018-10-21 – 2018-10-22 (×2): 4 mg via ORAL
  Filled 2018-10-20: qty 14
  Filled 2018-10-20 (×4): qty 2

## 2018-10-20 MED ORDER — ARIPIPRAZOLE 2 MG PO TABS
2.0000 mg | ORAL_TABLET | Freq: Once | ORAL | Status: DC
  Filled 2018-10-20: qty 1

## 2018-10-20 NOTE — Plan of Care (Signed)
  Problem: Education: Goal: Knowledge of [facility] Education information/materials will improve Outcome: Progressing   Problem: Safety: Goal: Periods of time without injury will increase Outcome: Progressing   

## 2018-10-20 NOTE — Progress Notes (Signed)
Recreation Therapy Notes  Date: 3.2.20 Time: 1000 Location: 500 Hall Dayroom  Group Topic: Coping Skills  Goal Area(s) Addresses:  Patient will identify triggers in which coping skills are needed. Patient will identify coping skills for each trigger. Patient will identify benefits of using coping skills post d/c.  Behavioral Response: Engaged  Intervention: Worksheet, pencils  Activity: Mind map.  LRT and patients will fill in the first 8 boxes (anger, stress, PTSD, anxiety, depression, money management, being impatient, lack of sleep) together.  Patients were then given time to come up with at least 3 coping skills for each trigger individually.  The group would come back together and LRT would write the coping skills on the board.  Patient could then fill in any empty spaces that remained on their sheets.  Education: Pharmacologist, Building control surveyor.   Education Outcome: Acknowledges understanding/In group clarification offered/Needs additional education.   Clinical Observations/Feedback: Pt identified some of her coping skills as running, eat, know your boundaries, take it easy, sex, cry, music, spend less money, save money, budget, write, dance, diet and read.    Caroll Rancher, LRT/CTRS         Caroll Rancher A 10/20/2018 11:51 AM

## 2018-10-20 NOTE — Progress Notes (Signed)
D:  Jaquasia was isolative to her room this evening.  She did get up and attended last 5 minutes of wrap up group then promptly returned to her room with her snack.  She was guarded on initial approach, sat up in the bed, her robe was open and she did nothing to cover her chest.  She talked slow, soft and was difficult for staff to understand.  She later came out of the room for medication and would only take the trazodone for sleep.  She later came out demanding more snack stating "I don't even get to pick my food out, you just bring me what you have.  I have a special diet that requires me to have extra snacks."  She was loud, irritable and argumentative.  She did agree to take her hs klonopin but refused Risperdal stating "I don't need that."  Explained to her that she will be able to go to the cafeteria to pick out her food and she did finally go to sleep.  She is currently resting with her eyes closed and appears to be asleep. A:  1:1 with RN for support and encouragement.  Medications as ordered.  Q 15 minute checks maintained for safety.  Encouraged participation in group and unit activities.   R:  Tilisa remains safe on the unit.  We will continue to monitor the progress towards her goals.

## 2018-10-20 NOTE — Plan of Care (Signed)
  Problem: Activity: Goal: Interest or engagement in activities will improve Outcome: Progressing   Problem: Education: Goal: Mental status will improve Outcome: Not Progressing Goal: Verbalization of understanding the information provided will improve Outcome: Not Progressing   
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
## 2018-10-20 NOTE — Progress Notes (Signed)
Tracie Hogan got up this morning, came to the nurses station demanding staff to print out song lyrics by Ulice Brilliant.  When she was told that the lyrics were filled with profanity and that staff would not be able to give her copies, she walked away from the nurses station, back to her room and slammed the door.  She came back out later demanding more but was redirected to the day room for vital signs.  She refused lab work this morning.  We will continue to monitor the progress towards her goals.  She refused to go to the cafeteria for breakfast.

## 2018-10-20 NOTE — Tx Team (Signed)
Interdisciplinary Treatment and Diagnostic Plan Update  10/20/2018 Time of Session: 0922 Tracie Hogan MRN: [id number]  Principal Diagnosis: <principal problem not specified>  Secondary Diagnoses: Active Problems:   Schizoaffective disorder (HCC)   Current Medications:  Current Facility-Administered Medications  Medication Dose Route Frequency Provider Last Rate Last Dose  . acetaminophen (TYLENOL) tablet 650 mg  650 mg Oral Q6H PRN Sharma Covert, MD      . alum & mag hydroxide-simeth (MAALOX/MYLANTA) 200-200-20 MG/5ML suspension 30 mL  30 mL Oral Q4H PRN Sharma Covert, MD      . ARIPiprazole (ABILIFY) tablet 2 mg  2 mg Oral Once [name], MD      . benztropine (COGENTIN) tablet 0.5 mg  0.5 mg Oral BID [name], MD      . clonazePAM Bobbye [place]) tablet 1 mg  1 mg Oral QHS [name], MD   1 mg at 10/20/18 0023  . hydrOXYzine (ATARAX/VISTARIL) tablet 25 mg  25 mg Oral TID PRN Sharma Covert, MD      . ibuprofen (ADVIL,MOTRIN) tablet 600 mg  600 mg Oral Q8H PRN Sharma Covert, MD      . lithium carbonate capsule 150 mg  150 mg Oral BID WC [name], MD      . LORazepam (ATIVAN) tablet 1 mg  1 mg Oral Q4H PRN Sharma Covert, MD      . OLANZapine zydis (ZYPREXA) disintegrating tablet 5 mg  5 mg Oral Q8H PRN Sharma Covert, MD       And  . LORazepam (ATIVAN) tablet 1 mg  1 mg Oral PRN Sharma Covert, MD       And  . ziprasidone (GEODON) injection 20 mg  20 mg Intramuscular PRN Sharma Covert, MD      . magnesium hydroxide (MILK OF MAGNESIA) suspension 30 mL  30 mL Oral Daily PRN Sharma Covert, MD      . nicotine (NICODERM CQ - dosed in mg/24 hours) patch 21 mg  21 mg Transdermal Daily Sharma Covert, MD   21 mg at 10/18/18 1746  . risperiDONE (RISPERDAL) tablet 4 mg  4 mg Oral QHS [name], MD      . traZODone (DESYREL) tablet 50 mg  50 mg Oral QHS PRN Sharma Covert, MD   50 mg at 10/19/18 2205   PTA Medications: No  medications prior to admission.    Patient Stressors: Traumatic event  Patient Strengths: Ability for insight Average or above average intelligence Capable of independent living Communication skills Physical Health  Treatment Modalities: Medication Management, Group therapy, Case management,  1 to 1 session with clinician, Psychoeducation, Recreational therapy.   Physician Treatment Plan for Primary Diagnosis: <principal problem not specified> Long Term Goal(s): Improvement in symptoms so as ready for discharge Improvement in symptoms so as ready for discharge   Short Term Goals: Ability to identify changes in lifestyle to reduce recurrence of condition will improve Ability to verbalize feelings will improve Ability to disclose and discuss suicidal ideas Ability to demonstrate self-control will improve Ability to identify and develop effective coping behaviors will improve Ability to maintain clinical measurements within normal limits will improve Compliance with prescribed medications will improve Ability to identify triggers associated with substance abuse/mental health issues will improve Ability to identify changes in lifestyle to reduce recurrence of condition will improve Ability to verbalize feelings will improve Ability to disclose and discuss suicidal ideas Ability to demonstrate self-control will improve Ability  to identify and develop effective coping behaviors will improve Ability to maintain clinical measurements within normal limits will improve Compliance with prescribed medications will improve Ability to identify triggers associated with substance abuse/mental health issues will improve  Medication Management: Evaluate patient's response, side effects, and tolerance of medication regimen.  Therapeutic Interventions: 1 to 1 sessions, Unit Group sessions and Medication administration.  Evaluation of Outcomes: Not Met  Physician Treatment Plan for Secondary  Diagnosis: Active Problems:   Schizoaffective disorder ([place])  Long Term Goal(s): Improvement in symptoms so as ready for discharge Improvement in symptoms so as ready for discharge   Short Term Goals: Ability to identify changes in lifestyle to reduce recurrence of condition will improve Ability to verbalize feelings will improve Ability to disclose and discuss suicidal ideas Ability to demonstrate self-control will improve Ability to identify and develop effective coping behaviors will improve Ability to maintain clinical measurements within normal limits will improve Compliance with prescribed medications will improve Ability to identify triggers associated with substance abuse/mental health issues will improve Ability to identify changes in lifestyle to reduce recurrence of condition will improve Ability to verbalize feelings will improve Ability to disclose and discuss suicidal ideas Ability to demonstrate self-control will improve Ability to identify and develop effective coping behaviors will improve Ability to maintain clinical measurements within normal limits will improve Compliance with prescribed medications will improve Ability to identify triggers associated with substance abuse/mental health issues will improve     Medication Management: Evaluate patient's response, side effects, and tolerance of medication regimen.  Therapeutic Interventions: 1 to 1 sessions, Unit Group sessions and Medication administration.  Evaluation of Outcomes: Not Met   RN Treatment Plan for Primary Diagnosis: <principal problem not specified> Long Term Goal(s): Knowledge of disease and therapeutic regimen to maintain health will improve  Short Term Goals: Ability to identify and develop effective coping behaviors will improve and Compliance with prescribed medications will improve  Medication Management: RN will administer medications as ordered by provider, will assess and evaluate patient's  response and provide education to patient for prescribed medication. RN will report any adverse and/or side effects to prescribing provider.  Therapeutic Interventions: 1 on 1 counseling sessions, Psychoeducation, Medication administration, Evaluate responses to treatment, Monitor vital signs and CBGs as ordered, Perform/monitor CIWA, COWS, AIMS and Fall Risk screenings as ordered, Perform wound care treatments as ordered.  Evaluation of Outcomes: Not Met   LCSW Treatment Plan for Primary Diagnosis: <principal problem not specified> Long Term Goal(s): Safe transition to appropriate next level of care at discharge, Engage patient in therapeutic group addressing interpersonal concerns.  Short Term Goals: Engage patient in aftercare planning with referrals and resources, Increase social support and Increase skills for wellness and recovery  Therapeutic Interventions: Assess for all discharge needs, 1 to 1 time with Social worker, Explore available resources and support systems, Assess for adequacy in community support network, Educate family and significant other(s) on suicide prevention, Complete Psychosocial Assessment, Interpersonal group therapy.  Evaluation of Outcomes: Not Met   Progress in Treatment: Attending groups: Yes. Participating in groups: Yes. Taking medication as prescribed: No. Toleration medication: No. Family/Significant other contact made: No, will contact:  declined consent Patient understands diagnosis: No. Discussing patient identified problems/goals with staff: Yes. Medical problems stabilized or resolved: Yes. Denies suicidal/homicidal ideation: Yes. Issues/concerns per patient self-inventory: No. Other: none  New problem(s) identified: No, Describe:  none  New Short Term/Long Term Goal(s):  Patient Goals:  "go home"  Discharge Plan or Barriers:  Reason for Continuation of Hospitalization: Delusions  Medication stabilization  Estimated Length of Stay:  3-5 days  Attendees: Patient:Tracie Hogan 10/20/2018   Physician: Dr. Jake Samples, MD 10/20/2018   Nursing: Baldo Daub, RN 10/20/2018   RN Care Manager: 10/20/2018   Social Worker: Lurline Idol, LCSW 10/20/2018   Recreational Therapist:  10/20/2018   Other:  10/20/2018   Other:  10/20/2018   Other: 10/20/2018        Scribe for Treatment Team: Joanne Chars, LCSW 10/20/2018 11:22 AM

## 2018-10-20 NOTE — Progress Notes (Signed)
Patient ID: Tracie Hogan, female   DOB: 24-Jul-1996, 23 y.o.   MRN: [id number]  While performing checks, MHT reports patient was observed sitting on HVAC unit with her feet soaking in her trash can which was filled completely with water. Patient stated, "I'm a different kind of breed". Writer instructed patient to dump out water due to sanitation concerns. Patient was compliant but irritable. Patient continues to refuse medications and states, "don't come at me again".
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
## 2018-10-21 MED ORDER — ARIPIPRAZOLE 2 MG PO TABS
2.0000 mg | ORAL_TABLET | Freq: Once | ORAL | Status: AC
  Administered 2018-10-21: 2 mg via ORAL
  Filled 2018-10-21 (×2): qty 1

## 2018-10-21 MED ORDER — ARIPIPRAZOLE ER 400 MG IM SRER: 400.0000 mg | Freq: Once | INTRAMUSCULAR | Status: DC

## 2018-10-21 NOTE — Progress Notes (Signed)
Nursing Progress Note: 7p-7a D: Pt currently presents with a labile/demanding affect and behavior. Pt states "I need lyrics from songs. I need them now." Interacting minimally with the milieu. Pt reports fair sleep during the previous night with current medication regimen. Pt did not attend wrap-up group.  A: Pt provided with medications per providers orders. Pt's labs and vitals were monitored throughout the night. Pt supported emotionally and encouraged to express concerns and questions. Pt educated on medications.  R: Pt's safety ensured with 15 minute and environmental checks. Pt currently denies SI, HI, and AVH. Pt verbally contracts to seek staff if SI,HI, or AVH occurs and to consult with staff before acting on any harmful thoughts. Will continue to monitor.

## 2018-10-21 NOTE — [facility] Group Notes (Signed)
[facility] LCSW Group Therapy Note  Date/Time: 10/21/18, 1100  Type of Therapy/Topic:  Group Therapy:  Feelings about Diagnosis  Participation Level:  Active   Mood:withdrawn   Description of Group:    This group will allow patients to explore their thoughts and feelings about diagnoses they have received. Patients will be guided to explore their level of understanding and acceptance of these diagnoses. Facilitator will encourage patients to process their thoughts and feelings about the reactions of others to their diagnosis, and will guide patients in identifying ways to discuss their diagnosis with significant others in their lives. This group will be process-oriented, with patients participating in exploration of their own experiences as well as giving and receiving support and challenge from other group members.   Therapeutic Goals: 1. Patient will demonstrate understanding of diagnosis as evidence by identifying two or more symptoms of the disorder:  2. Patient will be able to express two feelings regarding the diagnosis 3. Patient will demonstrate ability to communicate their needs through discussion and/or role plays  Summary of Patient Progress: Pt was more engaged than yesterday.  She made several spontaneous comments during group discussion, which were appropriate.  CSW asked her if she was aware of her diagnosis and she responded that she did not believe that she has any diagnosis at all.  She was more involved in the group discussion regarding stigma and she made several good comments.         Therapeutic Modalities:   Cognitive Behavioral Therapy Brief Therapy Feelings Identification   Daleen Squibb, LCSW

## 2018-10-21 NOTE — Plan of Care (Addendum)
D: Patient is in her room on approach. Patient will leave her room to ask for things but does not interact with the other patients. Patient denies SI, HI, AVH. Patient denies physical symptoms/pain. Patient is not cooperative with care. Patient let robe fall open with no clothes underneath when MHT was taking her vitals in the hall beside the door to her room. Patient affect and mood are flat. Patient eye contact is intense/staring and her tone of voice is argumentative.   Patient informed that she needs to keep clothes on. Patient responded "ya'll came in to get me out of my room". This RN reminded the patient that she is in the hospital and it is her hospital room so she needs to stay clothed.    A: Scheduled risperdal not administered per MD order, patient refused. Klonopin administered per MD order. PRN trazodone administered per MD order. Support provided. Patient educated on safety on the unit and medications. Routine safety checks every 15 minutes. Patient stated understanding to tell nurse about any new physical symptoms. Patient understands to tell staff of any needs.     R: No adverse drug reactions noted. Patient verbally contracts for safety. Patient remains safe at this time and will continue to monitor.   Problem: Safety: Goal: Periods of time without injury will increase Outcome: Progressing   Problem: Education: Goal: Mental status will improve Outcome: Not Progressing   Patient is not cooperative with care, behavior is bizarre: disrobing, intense eye contact. Patient remains safe and will continue to monitor.

## 2018-10-21 NOTE — Progress Notes (Signed)
Patient ID: Tracie Hogan, female   DOB: [birth year], 23 y.o.   MRN: [id number]  Patient was agreeable to taking 2 mg Abilify now and receiving Maintena injection tomorrow in order to discharge.

## 2018-10-21 NOTE — Progress Notes (Signed)
[facility] MD Progress Note  10/21/2018 8:16 AM Tracie Hogan  MRN:  [id number] Subjective:    She remains somewhat aloof and bizarre in her behavior as well as irritable on approach.  She states "I am only here 5 days" and wants to know if she can leave sooner.  I am not sure where she got the idea that it is a 5-day stay that is mandatory. She simply does not answer many of the questions of the mental status exam although I do not think it is thought blocking I think is simply oppositional behavior. She resist the fact that she has a bipolar type condition but I discussed with her the need for long-acting injectable, the importance of not driving so forth.  Alert oriented to person place situation month year not date poorly cooperative with some aspects of mental status exam but does deny suicidal and homicidal thoughts and denies hallucinations  Principal Problem: Exacerbation and underlying psychotic disorder Diagnosis: Active Problems:   Schizoaffective disorder (HCC)  Total Time spent with patient: 20 minutes   Past Medical History: History reviewed. No pertinent past medical history.  Past Surgical History:  Procedure Laterality Date  . TONSILLECTOMY     Family History: History reviewed. No pertinent family history. Family Psychiatric  History: ukn Social History:  Social History   Substance and Sexual Activity  Alcohol Use No     Social History   Substance and Sexual Activity  Drug Use No    Social History   Socioeconomic History  . Marital status: Single    Spouse name: Not on file  . Number of children: Not on file  . Years of education: Not on file  . Highest education level: Not on file  Occupational History  . Not on file  Social Needs  . Financial resource strain: Not on file  . Food insecurity:    Worry: Not on file    Inability: Not on file  . Transportation needs:    Medical: Not on file    Non-medical: Not on file  Tobacco Use  . Smoking status: Never  Smoker  . Smokeless tobacco: Never Used  Substance and Sexual Activity  . Alcohol use: No  . Drug use: No  . Sexual activity: Not Currently  Lifestyle  . Physical activity:    Days per week: Not on file    Minutes per session: Not on file  . Stress: Not on file  Relationships  . Social connections:    Talks on phone: Not on file    Gets together: Not on file    Attends religious service: Not on file    Active member of club or organization: Not on file    Attends meetings of clubs or organizations: Not on file    Relationship status: Not on file  Other Topics Concern  . Not on file  Social History Narrative  . Not on file   Sleep: Fair  Appetite:  Good  Current Medications: Current Facility-Administered Medications  Medication Dose Route Frequency Provider Last Rate Last Dose  . acetaminophen (TYLENOL) tablet 650 mg  650 mg Oral Q6H PRN Antonieta Pert, MD      . alum & mag hydroxide-simeth (MAALOX/MYLANTA) 200-200-20 MG/5ML suspension 30 mL  30 mL Oral Q4H PRN Antonieta Pert, MD   30 mL at 10/21/18 0610  . ARIPiprazole (ABILIFY) tablet 2 mg  2 mg Oral Once Malvin Johns, MD      . ARIPiprazole ER (ABILIFY  MAINTENA) injection 400 mg  400 mg Intramuscular Once Malvin Johns, MD      . benztropine (COGENTIN) tablet 0.5 mg  0.5 mg Oral BID Malvin Johns, MD      . clonazePAM Scarlette Calico) tablet 1 mg  1 mg Oral QHS Malvin Johns, MD   1 mg at 10/20/18 2056  . hydrOXYzine (ATARAX/VISTARIL) tablet 25 mg  25 mg Oral TID PRN Antonieta Pert, MD      . ibuprofen (ADVIL,MOTRIN) tablet 600 mg  600 mg Oral Q8H PRN Antonieta Pert, MD      . lithium carbonate capsule 150 mg  150 mg Oral BID WC Malvin Johns, MD      . LORazepam (ATIVAN) tablet 1 mg  1 mg Oral Q4H PRN Antonieta Pert, MD      . OLANZapine zydis (ZYPREXA) disintegrating tablet 5 mg  5 mg Oral Q8H PRN Antonieta Pert, MD       And  . LORazepam (ATIVAN) tablet 1 mg  1 mg Oral PRN Antonieta Pert, MD        And  . ziprasidone (GEODON) injection 20 mg  20 mg Intramuscular PRN Antonieta Pert, MD      . magnesium hydroxide (MILK OF MAGNESIA) suspension 30 mL  30 mL Oral Daily PRN Antonieta Pert, MD      . nicotine (NICODERM CQ - dosed in mg/24 hours) patch 21 mg  21 mg Transdermal Daily Antonieta Pert, MD   21 mg at 10/18/18 1746  . risperiDONE (RISPERDAL) tablet 4 mg  4 mg Oral QHS Malvin Johns, MD      . traZODone (DESYREL) tablet 50 mg  50 mg Oral QHS PRN Antonieta Pert, MD   50 mg at 10/20/18 2056    Lab Results: No results found for this or any previous visit (from the past 48 hour(s)).  Blood Alcohol level:  Lab Results  Component Value Date   ETH <10 10/17/2018   ETH <10 05/14/2018    Metabolic Disorder Labs: No results found for: HGBA1C, MPG No results found for: PROLACTIN No results found for: CHOL, TRIG, HDL, CHOLHDL, VLDL, LDLCALC  Physical Findings: AIMS: Facial and Oral Movements Muscles of Facial Expression: None, normal Lips and Perioral Area: None, normal Jaw: None, normal Tongue: None, normal,Extremity Movements Upper (arms, wrists, hands, fingers): None, normal Lower (legs, knees, ankles, toes): None, normal, Trunk Movements Neck, shoulders, hips: None, normal, Overall Severity Severity of abnormal movements (highest score from questions above): None, normal Incapacitation due to abnormal movements: None, normal Patient's awareness of abnormal movements (rate only patient's report): No Awareness, Dental Status Current problems with teeth and/or dentures?: No Does patient usually wear dentures?: No  CIWA:    COWS:     Musculoskeletal: Strength & Muscle Tone: within normal limits Gait & Station: normal Patient leans: N/A  Psychiatric Specialty Exam: Physical Exam  ROS no EPS on exam  Blood pressure (!) 126/93, pulse 97, temperature 98.4 F (36.9 C), temperature source Oral, resp. rate 18, height 5\' 1"  (1.549 m), weight 45.4 kg, SpO2 100 %.Body  mass index is 18.89 kg/m.  General Appearance: Casual-wearing a shirt backwards with hood in front  Eye Contact:  Minimal  Speech:  Slow  Volume:  Decreased  Mood:  Irritable  Affect:  Restricted  Thought Process:  Irrelevant  Orientation:  Other:  Nursing place month year general situation  Thought Content:  Tangential  Suicidal Thoughts:  No  Homicidal Thoughts:  No  Memory:  Not participating in memory testing  Judgement:  Impaired  Insight:  Shallow  Psychomotor Activity:  Decreased  Concentration:  Concentration: Fair  Recall: not  Participating in Materials engineer of Knowledge:  Fair  Language:  Fair  Akathisia:  Negative  Handed:  Right  AIMS (if indicated):     Assets:  Leisure Time Physical Health Resilience  ADL's:  Intact  Cognition:  WNL  Sleep:  Number of Hours: 5.25     Treatment Plan Summary: Daily contact with patient to assess and evaluate symptoms and progress in treatment, Medication management and Plan Continue current antipsychotics therapy and reality based therapy we will administer long-acting injectable today hopefully will comply discussed with the risk benefits side effects she did not ask further questions but did not rejected this type of therapy  Yaritzel Stange, MD 10/21/2018, 8:16 AM

## 2018-10-21 NOTE — Progress Notes (Signed)
CSW met with pt to discuss SPE and discharge plan.  Pt signed ROI in ED for her mother but there was note on handoff that pt was not sure she wanted her mother contacted. Today, pt says she does not want her mother contacted, does not want follow up and was told she would be discharged after 5 days.   Winferd Humphrey, MSW, LCSW Clinical Social Worker 10/21/2018 2:01 PM
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
## 2018-10-22 DIAGNOSIS — F250 Schizoaffective disorder, bipolar type: Principal | ICD-10-CM

## 2018-10-22 MED ORDER — ARIPIPRAZOLE ER 400 MG IM SRER
400.0000 mg | Freq: Once | INTRAMUSCULAR | Status: AC
  Administered 2018-10-22: 400 mg via INTRAMUSCULAR

## 2018-10-22 NOTE — Progress Notes (Signed)
Did not attend. 

## 2018-10-22 NOTE — [facility] Group Notes (Signed)
Occupational Therapy Group Note  Date:  10/22/2018 Time:  2:19 PM  Group Topic/Focus:  Stress Management  Participation Level:  Active  Participation Quality:  Appropriate  Affect:  Flat  Cognitive:  Oriented  Insight: Lacking  Engagement in Group:  Engaged  Modes of Intervention:  Activity, Discussion, Education and Socialization  Additional Comments:    S: "I like to listen to music when I am stressed"  O: Education given on stress management and healthy coping mechanisms. Pt encouraged to brainstorm with other peers and discuss what has worked in the past vs what has not. Pts further encouraged to discuss new coping stress management strategies to implement this date. Art activity made to display preferred coping mechanisms, along with incorporating the stress management outlet of coloring/art.  A: Pt presents with flat affect, engaged and participatory throughout session. Pt shares that music, [employer], and conspiracy theories are her preferred stress management skills. Pt engaged in art activity, needing mod A- becoming mildly frustrated stating that "this was not abstract art". Pt able to attend to instruction with cues for affirmation.  P: OT group will be x1 per week while pt inpatient.  Dalphine Handing, MSOT, OTR/L Behavioral Health OT/ Acute Relief OT PHP Office: [phone]  Dalphine Handing 10/22/2018, 2:19 PM

## 2018-10-22 NOTE — Progress Notes (Signed)
Nursing Progress Note: 7p-7a D: Pt currently presents with a depressed/empty/flat affect and behavior. Interacting minimally with the milieu. Pt reports good sleep during the previous night with current medication regimen. Pt did not attend wrap-up group.  A: Pt provided with medications per providers orders. Pt's labs and vitals were monitored throughout the night. Pt supported emotionally and encouraged to express concerns and questions. Pt educated on medications.  R: Pt's safety ensured with 15 minute and environmental checks. Pt currently denies SI, HI, and AVH. Pt verbally contracts to seek staff if SI,HI, or AVH occurs and to consult with staff before acting on any harmful thoughts. Will continue to monitor.

## 2018-10-22 NOTE — Progress Notes (Signed)
CSW met with pt to discuss discharge plan.  Pt in room writing something, willing to talk and more pleasant than usual.  Pt acknowledges that she has taken medication and said she feels "more stable."  CSW asked her several questions regarding the circumstances of her admission.  Pt was able to recount that the police officers found her with a teddy bear breastfeeding it and acknowledges that she was having a "rosemary's baby" type moment.  Pt acknowledged that her thoughts were probably not clear at the time and we discussed that her medication should help with that and also help her remain employed. Pt asked if she would take both pills and the shot upon discharge? CSW asked her to discuss this with Dr. Jake Samples.  Pt lives with her boyfriend in [place], said her "things are at his place" and that she has spoken to him by phone and plans to return there.  Pt willing to follow up with psychiatrist in [place] and [place] will locate appt for her.   Tracie Hogan, MSW, LCSW Clinical Social Worker 10/22/2018 1:28 PM

## 2018-10-22 NOTE — Progress Notes (Signed)
CSW obtained [employer] information from pt and forwarded to UR.  Received email back from Thurman Coyer that he looked pt up and [employer] reports pt "ineligible" and not covered. Garner Nash, MSW, LCSW Clinical Social Worker 10/22/2018 3:12 PM

## 2018-10-22 NOTE — Progress Notes (Signed)
[facility] MD Progress Note  10/22/2018 7:45 AM Tracie Hogan  MRN:  [id number] Subjective:   Patient reports new compliance with medications understanding this is her only way to be discharged again very focused on discharge denying all positive symptoms.  Denies wanting to harm self or others.  Overall does show slight improvement with the structure of hospital care and with brief med compliance and had tolerated oral aripiprazole without incident and therefore is asking for long-acting injectable as she knows this is critical to discharge planning. No EPS or TD  Principal Problem: Exacerbation and underlying psychotic disorder/chronic noncompliance/chronic denial of illness Diagnosis: Active Problems:   Schizoaffective disorder (HCC)  Total Time spent with patient: 30 minutes   Past Medical History: History reviewed. No pertinent past medical history.  Past Surgical History:  Procedure Laterality Date  . TONSILLECTOMY     Family History: History reviewed. No pertinent family history. Family Psychiatric  History: no new data Social History:  Social History   Substance and Sexual Activity  Alcohol Use No     Social History   Substance and Sexual Activity  Drug Use No    Social History   Socioeconomic History  . Marital status: Single    Spouse name: Not on file  . Number of children: Not on file  . Years of education: Not on file  . Highest education level: Not on file  Occupational History  . Not on file  Social Needs  . Financial resource strain: Not on file  . Food insecurity:    Worry: Not on file    Inability: Not on file  . Transportation needs:    Medical: Not on file    Non-medical: Not on file  Tobacco Use  . Smoking status: Never Smoker  . Smokeless tobacco: Never Used  Substance and Sexual Activity  . Alcohol use: No  . Drug use: No  . Sexual activity: Not Currently  Lifestyle  . Physical activity:    Days per week: Not on file    Minutes per session:  Not on file  . Stress: Not on file  Relationships  . Social connections:    Talks on phone: Not on file    Gets together: Not on file    Attends religious service: Not on file    Active member of club or organization: Not on file    Attends meetings of clubs or organizations: Not on file    Relationship status: Not on file  Other Topics Concern  . Not on file  Social History Narrative  . Not on file     Sleep: Good  Appetite:  Good  Current Medications: Current Facility-Administered Medications  Medication Dose Route Frequency Provider Last Rate Last Dose  . acetaminophen (TYLENOL) tablet 650 mg  650 mg Oral Q6H PRN Antonieta Pert, MD      . alum & mag hydroxide-simeth (MAALOX/MYLANTA) 200-200-20 MG/5ML suspension 30 mL  30 mL Oral Q4H PRN Antonieta Pert, MD   30 mL at 10/21/18 0610  . ARIPiprazole ER (ABILIFY MAINTENA) injection 400 mg  400 mg Intramuscular Once Malvin Johns, MD      . benztropine (COGENTIN) tablet 0.5 mg  0.5 mg Oral BID Malvin Johns, MD   0.5 mg at 10/22/18 0738  . clonazePAM (KLONOPIN) tablet 1 mg  1 mg Oral QHS Malvin Johns, MD   1 mg at 10/21/18 2123  . hydrOXYzine (ATARAX/VISTARIL) tablet 25 mg  25 mg Oral TID PRN Landry Mellow  Hart Rochester, MD      . ibuprofen (ADVIL,MOTRIN) tablet 600 mg  600 mg Oral Q8H PRN Antonieta Pert, MD      . lithium carbonate capsule 150 mg  150 mg Oral BID WC Malvin Johns, MD   150 mg at 10/22/18 0738  . LORazepam (ATIVAN) tablet 1 mg  1 mg Oral Q4H PRN Antonieta Pert, MD      . OLANZapine zydis (ZYPREXA) disintegrating tablet 5 mg  5 mg Oral Q8H PRN Antonieta Pert, MD       And  . LORazepam (ATIVAN) tablet 1 mg  1 mg Oral PRN Antonieta Pert, MD       And  . ziprasidone (GEODON) injection 20 mg  20 mg Intramuscular PRN Antonieta Pert, MD      . magnesium hydroxide (MILK OF MAGNESIA) suspension 30 mL  30 mL Oral Daily PRN Antonieta Pert, MD      . nicotine (NICODERM CQ - dosed in mg/24 hours) patch 21 mg   21 mg Transdermal Daily Antonieta Pert, MD   21 mg at 10/18/18 1746  . risperiDONE (RISPERDAL) tablet 4 mg  4 mg Oral QHS Malvin Johns, MD   4 mg at 10/21/18 2123  . traZODone (DESYREL) tablet 50 mg  50 mg Oral QHS PRN Antonieta Pert, MD   50 mg at 10/21/18 2123    Lab Results: No results found for this or any previous visit (from the past 48 hour(s)).  Blood Alcohol level:  Lab Results  Component Value Date   ETH <10 10/17/2018   ETH <10 05/14/2018    Metabolic Disorder Labs: No results found for: HGBA1C, MPG No results found for: PROLACTIN No results found for: CHOL, TRIG, HDL, CHOLHDL, VLDL, LDLCALC  Physical Findings: AIMS: Facial and Oral Movements Muscles of Facial Expression: None, normal Lips and Perioral Area: None, normal Jaw: None, normal Tongue: None, normal,Extremity Movements Upper (arms, wrists, hands, fingers): None, normal Lower (legs, knees, ankles, toes): None, normal, Trunk Movements Neck, shoulders, hips: None, normal, Overall Severity Severity of abnormal movements (highest score from questions above): None, normal Incapacitation due to abnormal movements: None, normal Patient's awareness of abnormal movements (rate only patient's report): No Awareness, Dental Status Current problems with teeth and/or dentures?: No Does patient usually wear dentures?: No  CIWA:    COWS:     Musculoskeletal: Strength & Muscle Tone: within normal limits Gait & Station: normal Patient leans: N/A  Psychiatric Specialty Exam: Physical Exam  ROS  Blood pressure 121/85, pulse (!) 111, temperature 97.9 F (36.6 C), temperature source Oral, resp. rate 18, height 5\' 1"  (1.549 m), weight 45.4 kg, SpO2 100 %.Body mass index is 18.89 kg/m.  General Appearance: Neat  Eye Contact:  Fair  Speech:  Clear and Coherent  Volume:  Normal  Mood:  Euthymic  Affect:  Congruent  Thought Process:  Descriptions of Associations: Tangential  Orientation:  Full (Time, Place, and  Person)  Thought Content:  Delusions possible but not discerned  Suicidal Thoughts:  No  Homicidal Thoughts:  No  Memory:  Immediate;   Fair  Judgement:  Fair  Insight:  Fair  Psychomotor Activity:  Normal  Concentration:  Concentration: Fair  Recall:  [employer] of Knowledge:  Fair  Language:  Fair  Akathisia:  Negative  Handed:  Right  AIMS (if indicated):     Assets:  Resilience Social Support  ADL's:  Intact  Cognition:  WNL  Sleep:  Number of Hours: 4.25     Treatment Plan Summary: Daily contact with patient to assess and evaluate symptoms and progress in treatment and Medication management administer long-acting injectable today probable discharge tomorrow no thoughts of harming self or others but continue current precautions continue reality-based therapy med and illness education  Malvin Johns, MD 10/22/2018, 7:45 AM

## 2018-10-22 NOTE — Progress Notes (Signed)
Patient denies SI, HI and AVH this shift.  Patient reports decreased symptoms and states medications have been effective.  Patient denies and medication side effects and complications.  Patient has attended groups and has had no behavioral dyscontrol.   Assess patient for safety, offer medications as prescribed engage patient in 1:1 staff talks.  Patient was able to contract for safety, continue to monitor as planned.

## 2018-10-23 MED ORDER — BENZTROPINE MESYLATE 0.5 MG PO TABS: 0.5000 mg | ORAL_TABLET | Freq: Two times a day (BID) | ORAL | 2 refills | Status: AC

## 2018-10-23 MED ORDER — ARIPIPRAZOLE ER 400 MG IM PRSY: 400.0000 mg | PREFILLED_SYRINGE | INTRAMUSCULAR | 11 refills | Status: AC

## 2018-10-23 MED ORDER — RISPERIDONE 4 MG PO TABS: 4.0000 mg | ORAL_TABLET | Freq: Every day | ORAL | 1 refills | Status: AC

## 2018-10-23 NOTE — BHH Suicide Risk Assessment (Signed)
[facility] Discharge Suicide Risk Assessment   Principal Problem: Exacerbation underlying schizoaffective/bipolar type condition Discharge Diagnoses: Active Problems:   Schizoaffective disorder (HCC)   Total Time spent with patient: 45 minutes  Patient believed to be at her baseline status she is alert oriented to person place time situation she does not have psychosis or manic symptoms no thoughts of harming self or others no EPS or TD Main issue is lack of insight and chronic noncompliance  Mental Status Per Nursing Assessment::   On Admission:  NA  Demographic Factors:  Low socioeconomic status  Loss Factors: NA  Historical Factors: Impulsivity  Risk Reduction Factors:   Religious beliefs about death  Continued Clinical Symptoms:  Bipolar Disorder:   Mixed State  Cognitive Features That Contribute To Risk:  Loss of executive function    Suicide Risk:  Minimal: No identifiable suicidal ideation.  Patients presenting with no risk factors but with morbid ruminations; may be classified as minimal risk based on the severity of the depressive symptoms  Follow-up Information    [facility]. Go in 3 day(s).   Why:  Please attend a walk in appt Monday-Friday at 830am.  Please request therapy and medication management services.  Contact information: [address] [place], [place] [address] P: [phone] F: [phone]          Plan Of Care/Follow-up recommendations:  Diet:  nl  Malvin Johns, MD 10/23/2018, 8:24 AM
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
## 2018-10-23 NOTE — Progress Notes (Signed)
  [facility] Adult Case Management Discharge Plan :  Will you be returning to the same living situation after discharge:  Yes,  with friend in [place]. At discharge, do you have transportation home?: Yes,  friend Do you have the ability to pay for your medications: No. Pt will work with [facility].  Release of information consent forms completed and in the chart;  Patient's signature needed at discharge.  Patient to Follow up at: Follow-up Information    [facility]. Go in 3 day(s).   Why:  Please attend a walk in appt Monday-Friday at 830am.  Please request therapy and medication management services.  Contact information: [address] [place], [place] [address] P: [phone] F: [phone]       [facility] Follow up.   Why:  If needed for medication management, attend a walk in appt Monday-Friday between 8:30am-12noon. Contact information: [address] [place] [address] [phone]           Next level of care provider has access to [facility] Link:no  Safety Planning and Suicide Prevention discussed: No. Pt declined consent.       Has patient been referred to the Quitline?: N/A patient is not a smoker  Patient has been referred for addiction treatment: Pt. refused referral  Lorri Frederick, LCSW 10/23/2018, 2:57 PM

## 2018-10-23 NOTE — Plan of Care (Signed)
Discharge note  Patient verbalizes readiness for discharge. Follow up plan explained, AVS, Transition record and SRA given. Prescriptions and teaching provided. Belongings returned and signed for. Suicide safety plan completed and signed. Patient verbalizes understanding. Patient denies SI/HI and assures this Clinical research associate he will seek assistance should that change. Patient discharged to lobby where her ride was waiting.  Problem: Education: Goal: Knowledge of [facility] Education information/materials will improve Outcome: Adequate for Discharge Goal: Emotional status will improve Outcome: Adequate for Discharge Goal: Mental status will improve Outcome: Adequate for Discharge Goal: Verbalization of understanding the information provided will improve Outcome: Adequate for Discharge   Problem: Activity: Goal: Interest or engagement in activities will improve Outcome: Adequate for Discharge Goal: Sleeping patterns will improve Outcome: Adequate for Discharge   Problem: Coping: Goal: Ability to verbalize frustrations and anger appropriately will improve Outcome: Adequate for Discharge Goal: Ability to demonstrate self-control will improve Outcome: Adequate for Discharge   Problem: Health Behavior/Discharge Planning: Goal: Identification of resources available to assist in meeting health care needs will improve Outcome: Adequate for Discharge Goal: Compliance with treatment plan for underlying cause of condition will improve Outcome: Adequate for Discharge   Problem: Physical Regulation: Goal: Ability to maintain clinical measurements within normal limits will improve Outcome: Adequate for Discharge   Problem: Safety: Goal: Periods of time without injury will increase Outcome: Adequate for Discharge   Problem: Activity: Goal: Will identify at least one activity in which they can participate Outcome: Adequate for Discharge   Problem: Coping: Goal: Ability to identify and  develop effective coping behavior will improve Outcome: Adequate for Discharge Goal: Ability to interact with others will improve Outcome: Adequate for Discharge Goal: Demonstration of participation in decision-making regarding own care will improve Outcome: Adequate for Discharge Goal: Ability to use eye contact when communicating with others will improve Outcome: Adequate for Discharge   Problem: Health Behavior/Discharge Planning: Goal: Identification of resources available to assist in meeting health care needs will improve Outcome: Adequate for Discharge   Problem: Self-Concept: Goal: Will verbalize positive feelings about self Outcome: Adequate for Discharge   Problem: Education: Goal: Ability to state activities that reduce stress will improve Outcome: Adequate for Discharge   Problem: Coping: Goal: Ability to identify and develop effective coping behavior will improve Outcome: Adequate for Discharge   Problem: Self-Concept: Goal: Ability to identify factors that promote anxiety will improve Outcome: Adequate for Discharge Goal: Level of anxiety will decrease Outcome: Adequate for Discharge Goal: Ability to modify response to factors that promote anxiety will improve Outcome: Adequate for Discharge   Problem: Education: Goal: Utilization of techniques to improve thought processes will improve Outcome: Adequate for Discharge Goal: Knowledge of the prescribed therapeutic regimen will improve Outcome: Adequate for Discharge   Problem: Activity: Goal: Interest or engagement in leisure activities will improve Outcome: Adequate for Discharge Goal: Imbalance in normal sleep/wake cycle will improve Outcome: Adequate for Discharge   Problem: Coping: Goal: Coping ability will improve Outcome: Adequate for Discharge Goal: Will verbalize feelings Outcome: Adequate for Discharge   Problem: Health Behavior/Discharge Planning: Goal: Ability to make decisions will  improve Outcome: Adequate for Discharge Goal: Compliance with therapeutic regimen will improve Outcome: Adequate for Discharge   Problem: Role Relationship: Goal: Will demonstrate positive changes in social behaviors and relationships Outcome: Adequate for Discharge   Problem: Safety: Goal: Ability to disclose and discuss suicidal ideas will improve Outcome: Adequate for Discharge Goal: Ability to identify and utilize support systems that promote safety will improve  Outcome: Adequate for Discharge   Problem: Self-Concept: Goal: Will verbalize positive feelings about self Outcome: Adequate for Discharge Goal: Level of anxiety will decrease Outcome: Adequate for Discharge

## 2018-10-23 NOTE — Progress Notes (Signed)
Patient discharged to lobby. Patient was stable and appreciative at that time. All papers and prescriptions were given and valuables returned. Verbal understanding expressed. Denies SI/HI and A/VH. Patient given opportunity to express concerns and ask questions.  

## 2018-10-23 NOTE — [facility] Group Notes (Signed)
[facility] LCSW Group Therapy Note  Date/Time: 10/23/18, 1315  Type of Therapy/Topic:  Group Therapy:  Balance in Life  Participation Level:  none  Description of Group:    This group will address the concept of balance and how it feels and looks when one is unbalanced. Patients will be encouraged to process areas in their lives that are out of balance, and identify reasons for remaining unbalanced. Facilitators will guide patients utilizing problem- solving interventions to address and correct the stressor making their life unbalanced. Understanding and applying boundaries will be explored and addressed for obtaining  and maintaining a balanced life. Patients will be encouraged to explore ways to assertively make their unbalanced needs known to significant others in their lives, using other group members and facilitator for support and feedback.  Therapeutic Goals: 1. Patient will identify two or more emotions or situations they have that consume much of in their lives. 2. Patient will identify signs/triggers that life has become out of balance:  3. Patient will identify two ways to set boundaries in order to achieve balance in their lives:  4. Patient will demonstrate ability to communicate their needs through discussion and/or role plays  Summary of Patient Progress:Pt came to group late, left early, and did not participate while she was present.           Therapeutic Modalities:   Cognitive Behavioral Therapy Solution-Focused Therapy Assertiveness Training  Daleen Squ[place], [place]
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
